# Patient Record
Sex: Male | Born: 1995
Health system: Southern US, Community
[De-identification: ages and names within clinical notes are randomized; demographics above are authoritative.]

## PROBLEM LIST (undated history)

## (undated) DIAGNOSIS — K5792 Diverticulitis of intestine, part unspecified, without perforation or abscess without bleeding: Secondary | ICD-10-CM

## (undated) DIAGNOSIS — I10 Essential (primary) hypertension: Secondary | ICD-10-CM

## (undated) HISTORY — PX: COLON SURGERY: SHX602

---
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
## 2015-09-16 ENCOUNTER — Emergency Department (HOSPITAL_COMMUNITY)
Admission: EM | Admit: 2015-09-16 | Discharge: 2015-09-16 | Disposition: A | Discharge status: Home / Self Care | Payer: Self-pay | Attending: Emergency Medicine | Admitting: Emergency Medicine

## 2015-09-16 ENCOUNTER — Encounter (HOSPITAL_COMMUNITY): Payer: Self-pay | Admitting: *Deleted

## 2015-09-16 DIAGNOSIS — H6123 Impacted cerumen, bilateral: Secondary | ICD-10-CM | POA: Insufficient documentation

## 2015-09-16 MED ORDER — NEOMYCIN-POLYMYXIN-HC 1 % OT SOLN
4.0000 [drp] | Freq: Four times a day (QID) | OTIC | Status: DC
  Administered 2015-09-16: 4 [drp] via OTIC
  Filled 2015-09-16: qty 10

## 2015-09-16 NOTE — Discharge Instructions (Signed)
Use the ear drops 4 drops in each ear three times a day for the next 5 days.   Cerumen Impaction The structures of the external ear canal secrete a waxy substance known as cerumen. Excess cerumen can build up in the ear canal, causing a condition known as cerumen impaction. Cerumen impaction can cause ear pain and disrupt the function of the ear. The rate of cerumen production differs for each individual. In certain individuals, the configuration of the ear canal may decrease his or her ability to naturally remove cerumen. CAUSES Cerumen impaction is caused by excessive cerumen production or buildup. RISK FACTORS  Frequent use of swabs to clean ears.  Having narrow ear canals.  Having eczema.  Being dehydrated. SIGNS AND SYMPTOMS  Diminished hearing.  Ear drainage.  Ear pain.  Ear itch. TREATMENT Treatment may involve:  Over-the-counter or prescription ear drops to soften the cerumen.  Removal of cerumen by a health care provider. This may be done with:  Irrigation with warm water. This is the most common method of removal.  Ear curettes and other instruments.  Surgery. This may be done in severe cases. HOME CARE INSTRUCTIONS  Take medicines only as directed by your health care provider.  Do not insert objects into the ear with the intent of cleaning the ear. PREVENTION  Do not insert objects into the ear, even with the intent of cleaning the ear. Removing cerumen as a part of normal hygiene is not necessary, and the use of swabs in the ear canal is not recommended.  Drink enough water to keep your urine clear or pale yellow.  Control your eczema if you have it. SEEK MEDICAL CARE IF:  You develop ear pain.  You develop bleeding from the ear.  The cerumen does not clear after you use ear drops as directed.   This information is not intended to replace advice given to you by your health care provider. Make sure you discuss any questions you have with your health  care provider.   Document Released: 04/09/2004 Document Revised: 03/23/2014 Document Reviewed: 10/17/2014 Elsevier Interactive Patient Education [employer]2016 Elsevier Inc.

## 2015-09-16 NOTE — ED Notes (Signed)
PA-C to see and assess patient before RN assessment. See PA note. 

## 2015-09-16 NOTE — ED Notes (Signed)
Patient verbalized understanding of discharge instructions and denies any further needs or questions at this time. VS stable. Patient ambulatory with steady gait, pt declined wheelchair, RN escorted to ED entrance.

## 2015-09-16 NOTE — ED Provider Notes (Signed)
CSN: [id number]     Arrival date & time 09/16/15  1500 History   First MD Initiated Contact with Patient 09/16/15 1623     Chief Complaint  Patient presents with  . Otalgia     (Consider location/radiation/quality/duration/timing/severity/associated sxs/prior Treatment) HPI Saunders GlanceStevie L Hendry is a 20 y.o. male who presents to the ED with left ear fullness that started over the last week and has gotten worse. He denies every having ear problems in the past and has not been swimming recently.   History reviewed. No pertinent past medical history. History reviewed. No pertinent past surgical history. No family history on file. Social History  Substance Use Topics  . Smoking status: Never Smoker   . Smokeless tobacco: None  . Alcohol Use: Yes     Comment: occ    Review of Systems Negative except as stated in HPI   Allergies  Review of patient's allergies indicates no known allergies.  Home Medications   Prior to Admission medications   Not on File   BP 154/72 mmHg  Pulse 70  Temp(Src) 98.4 F (36.9 C)  Resp 16  SpO2 99% Physical Exam  Constitutional: He is oriented to person, place, and time. He appears well-developed and well-nourished. No distress.  HENT:  Head: Normocephalic.  Bilateral cerumen impaction  Eyes: Conjunctivae and EOM are normal.  Neck: Normal range of motion. Neck supple.  Cardiovascular: Normal rate.   Pulmonary/Chest: Effort normal.  Abdominal: Soft.  Musculoskeletal: Normal range of motion.  Neurological: He is alert and oriented to person, place, and time. No cranial nerve deficit.  Skin: Skin is warm and dry.  Psychiatric: He has a normal mood and affect. His behavior is normal.  Nursing note and vitals reviewed.   ED Course  Procedures (including critical care time) Ear irrigation by me with good results, large amount of Cerumen removed from both ear canals.  Patient tolerated the procedure without any immediate complications.  Re examined  after irrigation, TM's normal, ear canals without mild erythema Cortisporin Otic Susp. 4 drops to each ear.  Labs Review Labs Reviewed - No data to display   MDM  20 y.o. male with decreased hearing and full feeling bilateral ears left >right stable for d/c without infection. Discussed with the patient and all questioned fully answered. He will return if any problems arise.  Final diagnoses:  Cerumen impaction, bilateral        Janne NapoleonHope M Antanasia Kaczynski, NP 09/17/15 [phone]  Marily MemosJason Mesner, MD 09/18/15 [phone]

## 2015-09-16 NOTE — ED Notes (Signed)
Left ear pain for a while and feels like there is a blockage in it and hard to hear.

## 2016-04-10 ENCOUNTER — Ambulatory Visit (HOSPITAL_COMMUNITY)
Admission: EM | Admit: 2016-04-10 | Discharge: 2016-04-10 | Disposition: A | Discharge status: Home / Self Care | Payer: Self-pay | Attending: Family Medicine | Admitting: Family Medicine

## 2016-04-10 ENCOUNTER — Encounter (HOSPITAL_COMMUNITY): Payer: Self-pay | Admitting: Emergency Medicine

## 2016-04-10 DIAGNOSIS — L308 Other specified dermatitis: Secondary | ICD-10-CM

## 2016-04-10 DIAGNOSIS — Z760 Encounter for issue of repeat prescription: Secondary | ICD-10-CM

## 2016-04-10 MED ORDER — TRIAMCINOLONE ACETONIDE 0.1 % EX CREA: 1.0000 "application " | TOPICAL_CREAM | Freq: Two times a day (BID) | CUTANEOUS | 2 refills | Status: AC

## 2016-04-10 MED ORDER — METHYLPREDNISOLONE ACETATE 80 MG/ML IJ SUSP
INTRAMUSCULAR | Status: AC
  Filled 2016-04-10: qty 1

## 2016-04-10 MED ORDER — METHYLPREDNISOLONE ACETATE 80 MG/ML IJ SUSP
80.0000 mg | Freq: Once | INTRAMUSCULAR | Status: AC
  Administered 2016-04-10: 80 mg via INTRAMUSCULAR

## 2016-04-10 NOTE — ED Triage Notes (Signed)
Here for eczema flare up onset 4 weeks... Needing refills on triamcinolone   Has not had cream since 12/2015  A&O x4.. NAD

## 2016-04-10 NOTE — ED Provider Notes (Signed)
[facility]    CSN: [id number] Arrival date & time: 04/10/16  1433     History   Chief Complaint Chief Complaint  Patient presents with  . Medication Refill  . Eczema    HPI Steven Wall is a 21 y.o. male.   The history is provided by the patient.  Medication Refill  Medications/supplies requested:  Triamcinolone cream Reason for request:  Medications ran out and prescriptions lost Medications taken before: yes - see home medications   Patient has complete original prescription information: yes (apply bid.)     History reviewed. No pertinent past medical history.  There are no active problems to display for this patient.   History reviewed. No pertinent surgical history.     Home Medications    Prior to Admission medications   Medication Sig Start Date End Date Taking? Authorizing Provider  triamcinolone cream (KENALOG) 0.1 % Apply 1 application topically 2 (two) times daily. 04/10/16   Linna HoffJames D Aceton Kinnear, MD    Family History No family history on file.  Social History Social History  Substance Use Topics  . Smoking status: Never Smoker  . Smokeless tobacco: Never Used  . Alcohol use Yes     Comment: occ     Allergies   Patient has no known allergies.   Review of Systems Review of Systems  Constitutional: Negative.   Musculoskeletal: Negative.   Skin: Positive for rash.  All other systems reviewed and are negative.    Physical Exam Triage Vital Signs ED Triage Vitals [04/10/16 1548]  Enc Vitals Group     BP 141/78     Pulse Rate 83     Resp 18     Temp 97.8 F (36.6 C)     Temp Source Oral     SpO2 100 %     Weight      Height      Head Circumference      Peak Flow      Pain Score 6     Pain Loc      Pain Edu?      Excl. in GC?    No data found.   Updated Vital Signs BP 141/78 (BP Location: Right Arm)   Pulse 83   Temp 97.8 F (36.6 C) (Oral)   Resp 18   SpO2 100%   Visual Acuity Right Eye Distance:   Left  Eye Distance:   Bilateral Distance:    Right Eye Near:   Left Eye Near:    Bilateral Near:     Physical Exam  Constitutional: He appears well-developed and well-nourished.  Skin: Skin is warm and dry. Rash noted.  Dry eczema rash over body.  Nursing note and vitals reviewed.    UC Treatments / Results  Labs (all labs ordered are listed, but only abnormal results are displayed) Labs Reviewed - No data to display  EKG  EKG Interpretation None       Radiology No results found.  Procedures Procedures (including critical care time)  Medications Ordered in UC Medications  methylPREDNISolone acetate (DEPO-MEDROL) injection 80 mg (80 mg Intramuscular Given 04/10/16 1636)     Initial Impression / Assessment and Plan / UC Course  I have reviewed the triage vital signs and the nursing notes.  Pertinent labs & imaging results that were available during my care of the patient were reviewed by me and considered in my medical decision making (see chart for details).  Final Clinical Impressions(s) / UC Diagnoses   Final diagnoses:  Encounter for medication refill    New Prescriptions Discharge Medication List as of 04/10/2016  4:31 PM       Linna Hoff, MD 04/16/16 [phone]

## 2016-04-10 NOTE — Discharge Instructions (Signed)
Use cream as needed, see your doctori if further refills are needed.

## 2018-01-25 ENCOUNTER — Emergency Department: Payer: Self-pay

## 2018-01-25 ENCOUNTER — Inpatient Hospital Stay
Admission: EM | Admit: 2018-01-25 | Discharge: 2018-01-28 | DRG: 392 | Disposition: A | Discharge status: Home / Self Care | Payer: Self-pay | Attending: Surgery | Admitting: Surgery

## 2018-01-25 ENCOUNTER — Other Ambulatory Visit: Payer: Self-pay

## 2018-01-25 DIAGNOSIS — K5720 Diverticulitis of large intestine with perforation and abscess without bleeding: Principal | ICD-10-CM | POA: Diagnosis present

## 2018-01-25 DIAGNOSIS — K5792 Diverticulitis of intestine, part unspecified, without perforation or abscess without bleeding: Secondary | ICD-10-CM

## 2018-01-25 DIAGNOSIS — K5732 Diverticulitis of large intestine without perforation or abscess without bleeding: Secondary | ICD-10-CM | POA: Diagnosis present

## 2018-01-25 DIAGNOSIS — E876 Hypokalemia: Secondary | ICD-10-CM | POA: Diagnosis not present

## 2018-01-25 LAB — COMPREHENSIVE METABOLIC PANEL
ALBUMIN: 3.8 g/dL (ref 3.5–5.0)
ALT: 13 U/L (ref 0–44)
ANION GAP: 13 (ref 5–15)
AST: 15 U/L (ref 15–41)
Alkaline Phosphatase: 47 U/L (ref 38–126)
BUN: 11 mg/dL (ref 6–20)
CHLORIDE: 99 mmol/L (ref 98–111)
CO2: 22 mmol/L (ref 22–32)
Calcium: 9.1 mg/dL (ref 8.9–10.3)
Creatinine, Ser: 1.19 mg/dL (ref 0.61–1.24)
GFR calc Af Amer: >60 mL/min (ref >60)
GFR calc non Af Amer: >60 mL/min (ref >60)
Glucose, Bld: 116 mg/dL — ABNORMAL HIGH (ref 70–99)
POTASSIUM: 3.9 mmol/L (ref 3.5–5.1)
SODIUM: 134 mmol/L — AB (ref 135–145)
Total Bilirubin: 0.8 mg/dL (ref 0.3–1.2)
Total Protein: 8.7 g/dL — ABNORMAL HIGH (ref 6.5–8.1)

## 2018-01-25 LAB — URINALYSIS, COMPLETE (UACMP) WITH MICROSCOPIC
BACTERIA UA: NONE SEEN
Bilirubin Urine: NEGATIVE
GLUCOSE, UA: NEGATIVE mg/dL
HGB URINE DIPSTICK: NEGATIVE
KETONES UR: 80 mg/dL — AB
Leukocytes, UA: NEGATIVE
NITRITE: NEGATIVE
PROTEIN: 30 mg/dL — AB
Specific Gravity, Urine: 1.027 (ref 1.005–1.030)
pH: 6 (ref 5.0–8.0)

## 2018-01-25 LAB — CBC
HEMATOCRIT: 42.6 % (ref 39.0–52.0)
HEMOGLOBIN: 14.7 g/dL (ref 13.0–17.0)
MCH: 29.8 pg (ref 26.0–34.0)
MCHC: 34.5 g/dL (ref 30.0–36.0)
MCV: 86.4 fL (ref 80.0–100.0)
Platelets: 238 10*3/uL (ref 150–400)
RBC: 4.93 MIL/uL (ref 4.22–5.81)
RDW: 12.4 % (ref 11.5–15.5)
WBC: 11.3 10*3/uL — ABNORMAL HIGH (ref 4.0–10.5)
nRBC: 0 % (ref 0.0–0.2)

## 2018-01-25 LAB — LIPASE, BLOOD: LIPASE: 35 U/L (ref 11–51)

## 2018-01-25 MED ORDER — OXYCODONE-ACETAMINOPHEN 5-325 MG PO TABS: 1.0000 | ORAL_TABLET | Freq: Four times a day (QID) | ORAL | 0 refills | Status: DC | PRN

## 2018-01-25 MED ORDER — METRONIDAZOLE IN NACL 5-0.79 MG/ML-% IV SOLN
500.0000 mg | Freq: Once | INTRAVENOUS | Status: AC
  Administered 2018-01-25: 500 mg via INTRAVENOUS
  Filled 2018-01-25: qty 100

## 2018-01-25 MED ORDER — MORPHINE SULFATE (PF) 4 MG/ML IV SOLN
4.0000 mg | Freq: Once | INTRAVENOUS | Status: AC
  Administered 2018-01-25: 4 mg via INTRAVENOUS

## 2018-01-25 MED ORDER — HYDROCODONE-ACETAMINOPHEN 5-325 MG PO TABS
1.0000 | ORAL_TABLET | ORAL | Status: DC | PRN
  Administered 2018-01-26: 2 via ORAL
  Administered 2018-01-26: 1 via ORAL
  Filled 2018-01-25: qty 2
  Filled 2018-01-25: qty 1

## 2018-01-25 MED ORDER — CIPROFLOXACIN IN D5W 400 MG/200ML IV SOLN
400.0000 mg | Freq: Once | INTRAVENOUS | Status: AC
  Administered 2018-01-25: 400 mg via INTRAVENOUS
  Filled 2018-01-25: qty 200

## 2018-01-25 MED ORDER — ONDANSETRON HCL 4 MG/2ML IJ SOLN
4.0000 mg | Freq: Once | INTRAMUSCULAR | Status: AC
  Administered 2018-01-25: 4 mg via INTRAVENOUS

## 2018-01-25 MED ORDER — MORPHINE SULFATE (PF) 4 MG/ML IV SOLN
INTRAVENOUS | Status: AC
  Administered 2018-01-25: 4 mg via INTRAVENOUS
  Filled 2018-01-25: qty 1

## 2018-01-25 MED ORDER — DOCUSATE SODIUM 100 MG PO CAPS: 100.0000 mg | ORAL_CAPSULE | Freq: Every day | ORAL | 0 refills | Status: DC

## 2018-01-25 MED ORDER — ONDANSETRON HCL 4 MG/2ML IJ SOLN: 4.0000 mg | Freq: Four times a day (QID) | INTRAMUSCULAR | Status: DC | PRN

## 2018-01-25 MED ORDER — ONDANSETRON 4 MG PO TBDP
4.0000 mg | ORAL_TABLET | Freq: Four times a day (QID) | ORAL | Status: DC | PRN
  Administered 2018-01-27: 4 mg via ORAL
  Filled 2018-01-25: qty 1

## 2018-01-25 MED ORDER — METRONIDAZOLE 500 MG PO TABS: 500.0000 mg | ORAL_TABLET | Freq: Three times a day (TID) | ORAL | 0 refills | Status: AC

## 2018-01-25 MED ORDER — METRONIDAZOLE IN NACL 5-0.79 MG/ML-% IV SOLN
500.0000 mg | Freq: Three times a day (TID) | INTRAVENOUS | Status: DC
  Administered 2018-01-25 – 2018-01-28 (×8): 500 mg via INTRAVENOUS
  Filled 2018-01-25 (×11): qty 100

## 2018-01-25 MED ORDER — PANTOPRAZOLE SODIUM 40 MG IV SOLR
40.0000 mg | Freq: Every day | INTRAVENOUS | Status: DC
  Administered 2018-01-25 – 2018-01-27 (×3): 40 mg via INTRAVENOUS
  Filled 2018-01-25 (×3): qty 40

## 2018-01-25 MED ORDER — SODIUM CHLORIDE 0.9 % IV BOLUS
1000.0000 mL | Freq: Once | INTRAVENOUS | Status: AC
  Administered 2018-01-25: 1000 mL via INTRAVENOUS

## 2018-01-25 MED ORDER — CIPROFLOXACIN IN D5W 400 MG/200ML IV SOLN
400.0000 mg | Freq: Two times a day (BID) | INTRAVENOUS | Status: DC
  Administered 2018-01-26 – 2018-01-27 (×5): 400 mg via INTRAVENOUS
  Filled 2018-01-25 (×7): qty 200

## 2018-01-25 MED ORDER — CIPROFLOXACIN HCL 500 MG PO TABS: 500.0000 mg | ORAL_TABLET | Freq: Two times a day (BID) | ORAL | 0 refills | Status: AC

## 2018-01-25 MED ORDER — TRAMADOL HCL 50 MG PO TABS
50.0000 mg | ORAL_TABLET | Freq: Four times a day (QID) | ORAL | Status: DC | PRN
  Administered 2018-01-26: 50 mg via ORAL
  Filled 2018-01-25: qty 1

## 2018-01-25 MED ORDER — ONDANSETRON 8 MG PO TBDP: 8.0000 mg | ORAL_TABLET | Freq: Three times a day (TID) | ORAL | 0 refills | Status: DC | PRN

## 2018-01-25 MED ORDER — IOPAMIDOL (ISOVUE-300) INJECTION 61%
30.0000 mL | Freq: Once | INTRAVENOUS | Status: AC
  Administered 2018-01-25: 30 mL via ORAL

## 2018-01-25 MED ORDER — ONDANSETRON HCL 4 MG/2ML IJ SOLN
INTRAMUSCULAR | Status: AC
  Administered 2018-01-25: 4 mg via INTRAVENOUS
  Filled 2018-01-25: qty 2

## 2018-01-25 MED ORDER — LACTATED RINGERS IV SOLN
INTRAVENOUS | Status: DC
  Administered 2018-01-25 – 2018-01-27 (×4): via INTRAVENOUS

## 2018-01-25 MED ORDER — MORPHINE SULFATE (PF) 2 MG/ML IV SOLN: 2.0000 mg | INTRAVENOUS | Status: DC | PRN

## 2018-01-25 MED ORDER — IOPAMIDOL (ISOVUE-300) INJECTION 61%
100.0000 mL | Freq: Once | INTRAVENOUS | Status: AC | PRN
  Administered 2018-01-25: 100 mL via INTRAVENOUS

## 2018-01-25 NOTE — ED Provider Notes (Signed)
[facility] Emergency Department Provider Note ____________________________________________   First MD Initiated Contact with Patient 01/25/18 0915     (approximate)  I have reviewed the triage vital signs and the nursing notes.   HISTORY  Chief Complaint Abdominal Pain and Emesis    HPI Steven Wall is a 22 y.o. male with no significant past medical history who presents with left lower quadrant abdominal pain, gradual onset over the last 8 to 9 days, worsening in the last day, constant, and associated with nausea with one episode of brownish vomiting today.  He denies any diarrhea.  He states it is been somewhat constipated and his last bowel movement was 3 days ago.  He denies any urinary symptoms or fever or chills.  No prior history of this pain.  History reviewed. No pertinent past medical history.  Patient Active Problem List   Diagnosis Date Noted  . Diverticulitis large intestine 01/25/2018    History reviewed. No pertinent surgical history.  Prior to Admission medications   Medication Sig Start Date End Date Taking? Authorizing Provider  ciprofloxacin (CIPRO) 500 MG tablet Take 1 tablet (500 mg total) by mouth 2 (two) times daily for 10 days. 01/25/18 02/04/18  Dionne Bucy, MD  docusate sodium (COLACE) 100 MG capsule Take 1 capsule (100 mg total) by mouth daily. 01/25/18   Dionne Bucy, MD  metroNIDAZOLE (FLAGYL) 500 MG tablet Take 1 tablet (500 mg total) by mouth 3 (three) times daily for 10 days. 01/25/18 02/04/18  Dionne Bucy, MD  ondansetron (ZOFRAN ODT) 8 MG disintegrating tablet Take 1 tablet (8 mg total) by mouth every 8 (eight) hours as needed for nausea or vomiting. 01/25/18   Dionne Bucy, MD  oxyCODONE-acetaminophen (PERCOCET) 5-325 MG tablet Take 1 tablet by mouth every 6 (six) hours as needed for up to 3 days for severe pain. 01/25/18 01/28/18  Dionne Bucy, MD  triamcinolone cream (KENALOG) 0.1  % Apply 1 application topically 2 (two) times daily. Patient not taking: Reported on 01/25/2018 04/10/16   Linna Hoff, MD    Allergies Patient has no known allergies.  No family history on file.  Social History Social History   Tobacco Use  . Smoking status: Never Smoker  . Smokeless tobacco: Never Used  Substance Use Topics  . Alcohol use: Yes    Comment: occ  . Drug use: No    Review of Systems  Constitutional: No fever.  Eyes: No redness.. ENT: No sore throat. Cardiovascular: Denies chest pain. Respiratory: Denies shortness of breath. Gastrointestinal: Positive for nausea.  Genitourinary: Negative for dysuria.  Musculoskeletal: Negative for back pain. Skin: Negative for rash. Neurological: Negative for headache.   ____________________________________________   PHYSICAL EXAM:  VITAL SIGNS: ED Triage Vitals  Enc Vitals Group     BP 01/25/18 0902 (!) 144/88     Pulse Rate 01/25/18 0902 86     Resp 01/25/18 0902 18     Temp 01/25/18 0902 98.6 F (37 C)     Temp Source 01/25/18 0902 Oral     SpO2 01/25/18 0902 100 %     Weight 01/25/18 0901 255 lb (115.7 kg)     Height 01/25/18 0901 6' (1.829 m)     Head Circumference --      Peak Flow --      Pain Score 01/25/18 0937 10     Pain Loc --      Pain Edu? --      Excl. in GC? --  Constitutional: Alert and oriented.  Uncomfortable appearing but in no acute distress. Eyes: Conjunctivae are normal.  No scleral icterus. Head: Atraumatic. Nose: No congestion/rhinnorhea. Mouth/Throat: Mucous membranes are slightly dry.   Neck: Normal range of motion.  Cardiovascular: Good peripheral circulation. Respiratory: Normal respiratory effort.  No retractions. Gastrointestinal: Soft with moderate left lower quadrant tenderness. No distention.  Genitourinary: No flank tenderness. Musculoskeletal: Extremities warm and well perfused.  Neurologic:  Normal speech and language. No gross focal neurologic deficits are  appreciated.  Skin:  Skin is warm and dry. No rash noted. Psychiatric: Mood and affect are normal. Speech and behavior are normal.  ____________________________________________   LABS (all labs ordered are listed, but only abnormal results are displayed)  Labs Reviewed  COMPREHENSIVE METABOLIC PANEL - Abnormal; Notable for the following components:      Result Value   Sodium 134 (*)    Glucose, Bld 116 (*)    Total Protein 8.7 (*)    All other components within normal limits  CBC - Abnormal; Notable for the following components:   WBC 11.3 (*)    All other components within normal limits  URINALYSIS, COMPLETE (UACMP) WITH MICROSCOPIC - Abnormal; Notable for the following components:   Color, Urine YELLOW (*)    APPearance CLEAR (*)    Ketones, ur 80 (*)    Protein, ur 30 (*)    All other components within normal limits  LIPASE, BLOOD   ____________________________________________  EKG   ____________________________________________  RADIOLOGY  CT abdomen: Acute diverticulitis  ____________________________________________   PROCEDURES  Procedure(s) performed: No  Procedures  Critical Care performed: No ____________________________________________   INITIAL IMPRESSION / ASSESSMENT AND PLAN / ED COURSE  Pertinent labs & imaging results that were available during my care of the patient were reviewed by me and considered in my medical decision making (see chart for details).  22 year old male with no significant past medical history presents with left lower quadrant abdominal pain which is been gradually worsening over the last 8 or 9 days, and associated with nausea and an episode of vomiting today.  On exam the patient is uncomfortable appearing.  His vital signs are normal except for hypertension.  He does have significant tenderness in the left lower quadrant.  Differential includes diverticulitis, colitis, UTI/cystitis, or ureteral  stone.  ----------------------------------------- 2:34 PM on 01/25/2018 -----------------------------------------  CT shows diverticulitis with 5 cm abscess.  I initiated IV antibiotics.  I consulted Dr. Tonna Boehringer from general surgery who has evaluated the patient.  Patient will be admitted to the inpatient surgery service.   ____________________________________________   FINAL CLINICAL IMPRESSION(S) / ED DIAGNOSES  Final diagnoses:  Diverticulitis      NEW MEDICATIONS STARTED DURING THIS VISIT:  New Prescriptions   CIPROFLOXACIN (CIPRO) 500 MG TABLET    Take 1 tablet (500 mg total) by mouth 2 (two) times daily for 10 days.   DOCUSATE SODIUM (COLACE) 100 MG CAPSULE    Take 1 capsule (100 mg total) by mouth daily.   METRONIDAZOLE (FLAGYL) 500 MG TABLET    Take 1 tablet (500 mg total) by mouth 3 (three) times daily for 10 days.   ONDANSETRON (ZOFRAN ODT) 8 MG DISINTEGRATING TABLET    Take 1 tablet (8 mg total) by mouth every 8 (eight) hours as needed for nausea or vomiting.   OXYCODONE-ACETAMINOPHEN (PERCOCET) 5-325 MG TABLET    Take 1 tablet by mouth every 6 (six) hours as needed for up to 3 days for severe pain.  Note:  This document was prepared using Dragon voice recognition software and may include unintentional dictation errors.    Dionne Bucy, MD 01/25/18 1434

## 2018-01-25 NOTE — ED Notes (Signed)
CT notified pt finished with contrast. Spoke with Sheralyn Boatman

## 2018-01-25 NOTE — ED Notes (Signed)
Surgeon at bedside.  

## 2018-01-25 NOTE — ED Notes (Signed)
Informed pt that oncoming nurse Jeannett SeniorStephen will be taking over his care. Urine sent to lab.

## 2018-01-25 NOTE — H&P (Signed)
Subjective:   CC: diverticulitis  HPI:  Steven Wall is a 22 y.o. male [facility]  Symptoms were first noted 1 week ago. Pain is sharp, confined to the LLQ, without radiation.  Associated with some nausea, changes in BM.  Exacerbated by nothing specific.  First episode, but has family history of diverticulitis   Past Medical History: none reported  Past Surgical History: denies any surgery  Family History: mother and grandfather with hx of diverticulitis  Social History:  reports that he has never smoked. He has never used smokeless tobacco. He reports that he drinks alcohol. He reports that he does not use drugs.  Current Medications: taking none  Allergies:  Allergies as of 01/25/2018  . (No Known Allergies)    ROS:  A 15 point review of systems was performed and pertinent positives and negatives noted in HPI   Objective:     BP (!) 141/72   Pulse 86   Temp 98.6 F (37 C) (Oral)   Resp 15   Ht 6' (1.829 m)   Wt 115.7 kg   SpO2 100%   BMI 34.58 kg/m   Constitutional :  alert, cooperative, appears stated age and no distress  Lymphatics/Throat:  no asymmetry, masses, or scars  Respiratory:  clear to auscultation bilaterally  Cardiovascular:  regular rate and rhythm  Gastrointestinal: soft, no guarding but focal tenderness noted in LLQ, no rebound tenderness.   Musculoskeletal: Steady gait and movement  Skin: Cool and moist   Psychiatric: Normal affect, non-agitated, not confused       LABS:  CMP Latest Ref Rng & Units 01/25/2018  Glucose 70 - 99 mg/dL 119(J116(H)  BUN 6 - 20 mg/dL 11  Creatinine 4.780.61 - 2.951.24 mg/dL 6.211.19  Sodium 308135 - 657145 mmol/L 134(L)  Potassium 3.5 - 5.1 mmol/L 3.9  Chloride 98 - 111 mmol/L 99  CO2 22 - 32 mmol/L 22  Calcium 8.9 - 10.3 mg/dL 9.1  Total Protein 6.5 - 8.1 g/dL 8.4(O8.7(H)  Total Bilirubin 0.3 - 1.2 mg/dL 0.8  Alkaline Phos 38 - 126 U/L 47  AST 15 - 41 U/L 15  ALT 0 - 44 U/L 13   CBC Latest Ref  Rng & Units 01/25/2018  WBC 4.0 - 10.5 K/uL 11.3(H)  Hemoglobin 13.0 - 17.0 g/dL 96.214.7  Hematocrit 95.239.0 - 52.0 % 42.6  Platelets 150 - 400 K/uL 238    RADS: CLINICAL DATA:  Lower abdominal pain for 1 week.  Fever and chills.  EXAM: CT ABDOMEN AND PELVIS WITH CONTRAST  TECHNIQUE: Multidetector CT imaging of the abdomen and pelvis was performed using the standard protocol following bolus administration of intravenous contrast.  CONTRAST:  100mL ISOVUE-300 IOPAMIDOL (ISOVUE-300) INJECTION 61%  COMPARISON:  None.  FINDINGS: Lower chest: The lung bases are clear of acute process. No pleural effusion or pulmonary lesions. The heart is normal in size. No pericardial effusion. The distal esophagus and aorta are unremarkable.  Hepatobiliary: No focal hepatic lesions or intrahepatic biliary dilatation. The gallbladder is normal. No common bile duct dilatation.  Pancreas: No mass, inflammation or ductal dilatation.  Spleen: Normal size.  No focal lesions.  Adrenals/Urinary Tract: The adrenal glands and kidneys are unremarkable. The bladder is normal.  Stomach/Bowel: The stomach, duodenum and small bowel are unremarkable. No acute inflammatory changes, mass lesions or obstructive findings.  Diverticulosis of the sigmoid colon is noted with acute diverticulitis and a large diverticular abscess measuring 5.3 x 3.8 cm.  The remainder of the colon is unremarkable except for scattered diverticuli. The appendix is normal.  Vascular/Lymphatic: The aorta is normal in caliber. No dissection. The branch vessels are patent. The major venous structures are patent. No mesenteric or retroperitoneal mass or adenopathy. Small scattered lymph nodes are noted.  Reproductive: The prostate gland and seminal vesicles are unremarkable.  Other: No inguinal mass or adenopathy.  No inguinal hernia.  Musculoskeletal: No significant findings.  IMPRESSION: 1. Acute diverticulitis  involving the sigmoid colon with associated 5 cm diverticular abscess. 2. No other acute abdominal findings and no mass lesions or adenopathy.   Electronically Signed   By: Rudie Meyer M.D.   On: 01/25/2018 11:12 Assessment:   Sigmoid diverticulitis with associated abscess.  Plan:   Only focal peritonitis noted on exam, with stable vitals and no evidence of uncontained perforation on exam.  Will discuss with IR if drainage is possible at this point.  Admit, NPO, cipro, flagyl, repeat serial exams and labs in the meantime.  No surgical intervention needed at this point.  Discussed in detail plan with patient, sister, and aunt and all are in agreement and voiced understanding.

## 2018-01-25 NOTE — ED Notes (Signed)
Pt reports lower abdominal pain since over a week ago, pt states the pain continues to worsen.  Reports vomiting, last vomited prior to leaving home.  Pt states he has not been able to eat since Saturday.  Denies any urinary sxs. Reports he has been able to drink water.  Pt shivering in room asked for several warm blankets.

## 2018-01-25 NOTE — ED Triage Notes (Signed)
[facility].  Vomit is reportedly brown.

## 2018-01-25 NOTE — Discharge Instructions (Addendum)
Take the antibiotics (ciprofloxacin and metronidazole) as prescribed and finish the full course even if your symptoms get better.  You should also take the Colace stool softener daily as needed  You may use the Zofran as needed for nausea.   tylenol and advil as needed for discomfort.  Please alternate between the two every four hours as needed for pain.    325-650mg  every 8hrs to max of 4000mg /24hrs for the tylenol.    Advil up to 800mg  per dose every 8hrs as needed for pain.    Return to the ER for new, worsening, persistent severe pain, nausea or vomiting, fever, or any other new or worsening symptoms that concern you.  Follow drain care teaching as done by nursing prior to discharge until followup with radiology group for possible removal

## 2018-01-26 ENCOUNTER — Inpatient Hospital Stay: Payer: Self-pay

## 2018-01-26 LAB — BASIC METABOLIC PANEL
Anion gap: 9 (ref 5–15)
BUN: 7 mg/dL (ref 6–20)
CHLORIDE: 102 mmol/L (ref 98–111)
CO2: 26 mmol/L (ref 22–32)
CREATININE: 1.08 mg/dL (ref 0.61–1.24)
Calcium: 8.5 mg/dL — ABNORMAL LOW (ref 8.9–10.3)
GFR calc Af Amer: >60 mL/min (ref >60)
GFR calc non Af Amer: >60 mL/min (ref >60)
Glucose, Bld: 87 mg/dL (ref 70–99)
Potassium: 3.6 mmol/L (ref 3.5–5.1)
Sodium: 137 mmol/L (ref 135–145)

## 2018-01-26 LAB — CBC
HCT: 40.8 % (ref 39.0–52.0)
HEMOGLOBIN: 13.6 g/dL (ref 13.0–17.0)
MCH: 30 pg (ref 26.0–34.0)
MCHC: 33.3 g/dL (ref 30.0–36.0)
MCV: 89.9 fL (ref 80.0–100.0)
Platelets: 200 10*3/uL (ref 150–400)
RBC: 4.54 MIL/uL (ref 4.22–5.81)
RDW: 12.7 % (ref 11.5–15.5)
WBC: 8.9 10*3/uL (ref 4.0–10.5)
nRBC: 0 % (ref 0.0–0.2)

## 2018-01-26 LAB — PHOSPHORUS: Phosphorus: 3.8 mg/dL (ref 2.5–4.6)

## 2018-01-26 LAB — MAGNESIUM: Magnesium: 1.9 mg/dL (ref 1.7–2.4)

## 2018-01-26 MED ORDER — SODIUM CHLORIDE 0.9% FLUSH
5.0000 mL | Freq: Three times a day (TID) | INTRAVENOUS | Status: DC
  Administered 2018-01-26 – 2018-01-28 (×6): 5 mL

## 2018-01-26 MED ORDER — MIDAZOLAM HCL 2 MG/2ML IJ SOLN
INTRAMUSCULAR | Status: AC | PRN
  Administered 2018-01-26 (×3): 1 mg via INTRAVENOUS

## 2018-01-26 MED ORDER — MIDAZOLAM HCL 5 MG/5ML IJ SOLN
INTRAMUSCULAR | Status: AC
  Filled 2018-01-26: qty 5

## 2018-01-26 MED ORDER — FENTANYL CITRATE (PF) 100 MCG/2ML IJ SOLN
INTRAMUSCULAR | Status: AC | PRN
  Administered 2018-01-26 (×2): 50 ug via INTRAVENOUS
  Administered 2018-01-26 (×2): 25 ug via INTRAVENOUS

## 2018-01-26 MED ORDER — FENTANYL CITRATE (PF) 100 MCG/2ML IJ SOLN
INTRAMUSCULAR | Status: AC
  Filled 2018-01-26: qty 4

## 2018-01-26 NOTE — Progress Notes (Signed)
Subjective:  CC:  Steven Wall is a 22 y.o. male  Hospital stay day 1,   acute diverticulitis  HPI: No issues overnight. Feeling better  ROS:  A 5 point review of systems was performed and pertinent positives and negatives noted in HPI.   Objective:      Temp:  [98 F (36.7 C)-99.4 F (37.4 C)] 98 F (36.7 C) (11/13 0736) Pulse Rate:  [79-104] 83 (11/13 0736) Resp:  [10-28] 12 (11/13 0736) BP: (116-156)/(56-97) 140/83 (11/13 0736) SpO2:  [96 %-100 %] 100 % (11/13 0736) Weight:  [115.7 kg] 115.7 kg (11/12 0902)     Height: 6' (182.9 cm) Weight: 115.7 kg BMI (Calculated): 34.58   Intake/Output this shift:  Total I/O In: 287.2 [I.V.:187.2; IV Piggyback:100] Out: -        Constitutional :  alert, cooperative, appears stated age and no distress  Respiratory:  clear to auscultation bilaterally  Cardiovascular:  regular rate and rhythm  Gastrointestinal: soft, non-tender; bowel sounds normal; no masses,  no organomegaly.   Skin: Cool and moist.   Psychiatric: Normal affect, non-agitated, not confused       LABS:  CMP Latest Ref Rng & Units 01/26/2018 01/25/2018  Glucose 70 - 99 mg/dL 87 161(W116(H)  BUN 6 - 20 mg/dL 7 11  Creatinine 9.600.61 - 1.24 mg/dL 4.541.08 0.981.19  Sodium 119135 - 145 mmol/L 137 134(L)  Potassium 3.5 - 5.1 mmol/L 3.6 3.9  Chloride 98 - 111 mmol/L 102 99  CO2 22 - 32 mmol/L 26 22  Calcium 8.9 - 10.3 mg/dL 1.4(N8.5(L) 9.1  Total Protein 6.5 - 8.1 g/dL - 8.7(H)  Total Bilirubin 0.3 - 1.2 mg/dL - 0.8  Alkaline Phos 38 - 126 U/L - 47  AST 15 - 41 U/L - 15  ALT 0 - 44 U/L - 13   CBC Latest Ref Rng & Units 01/26/2018 01/25/2018  WBC 4.0 - 10.5 K/uL 8.9 11.3(H)  Hemoglobin 13.0 - 17.0 g/dL 82.913.6 56.214.7  Hematocrit 13.039.0 - 52.0 % 40.8 42.6  Platelets 150 - 400 K/uL 200 238    RADS: n/a Assessment:   Sigmoid diverticulitis.  Plan for IR drainage today.  Will cont abx for infection and monitor.

## 2018-01-26 NOTE — Progress Notes (Signed)
patiient clinically stable post  Percutaneous drain placement for abscess per DR Hoss, tolerated well with vitals stable. [facility] with questions answered. Pt remained stable throughout entire procedure.

## 2018-01-26 NOTE — Consult Note (Signed)
Chief Complaint: Patient was seen in consultation today for  Chief Complaint  Patient presents with  . Abdominal Pain  . Emesis   at the request of * No referring provider recorded for this case *  Referring Physician(s): * No referring provider recorded for this case *  Supervising Physician: Jolaine Click  Patient Status: [facility] - In-pt  History of Present Illness: Steven Wall is a 22 y.o. male with acute diverticulitis and a small peri sigmoid abscess. He is referred for drainage.  History reviewed. No pertinent past medical history.  History reviewed. No pertinent surgical history.  Allergies: Patient has no known allergies.  Medications: Prior to Admission medications   Medication Sig Start Date End Date Taking? Authorizing Provider  ciprofloxacin (CIPRO) 500 MG tablet Take 1 tablet (500 mg total) by mouth 2 (two) times daily for 10 days. 01/25/18 02/04/18  Dionne Bucy, MD  docusate sodium (COLACE) 100 MG capsule Take 1 capsule (100 mg total) by mouth daily. 01/25/18   Dionne Bucy, MD  metroNIDAZOLE (FLAGYL) 500 MG tablet Take 1 tablet (500 mg total) by mouth 3 (three) times daily for 10 days. 01/25/18 02/04/18  Dionne Bucy, MD  ondansetron (ZOFRAN ODT) 8 MG disintegrating tablet Take 1 tablet (8 mg total) by mouth every 8 (eight) hours as needed for nausea or vomiting. 01/25/18   Dionne Bucy, MD  oxyCODONE-acetaminophen (PERCOCET) 5-325 MG tablet Take 1 tablet by mouth every 6 (six) hours as needed for up to 3 days for severe pain. 01/25/18 01/28/18  Dionne Bucy, MD  triamcinolone cream (KENALOG) 0.1 % Apply 1 application topically 2 (two) times daily. Patient not taking: Reported on 01/25/2018 04/10/16   Linna Hoff, MD     No family history on file.  Social History   Socioeconomic History  . Marital status: Single    Spouse name: Not on file  . Number of children: Not on file  . Years of education: Not on file  .  Highest education level: Not on file  Occupational History  . Not on file  Social Needs  . Financial resource strain: Not on file  . Food insecurity:    Worry: Not on file    Inability: Not on file  . Transportation needs:    Medical: Not on file    Non-medical: Not on file  Tobacco Use  . Smoking status: Never Smoker  . Smokeless tobacco: Never Used  Substance and Sexual Activity  . Alcohol use: Yes    Comment: occ  . Drug use: No  . Sexual activity: Not on file  Lifestyle  . Physical activity:    Days per week: Not on file    Minutes per session: Not on file  . Stress: Not on file  Relationships  . Social connections:    Talks on phone: Not on file    Gets together: Not on file    Attends religious service: Not on file    Active member of club or organization: Not on file    Attends meetings of clubs or organizations: Not on file    Relationship status: Not on file  Other Topics Concern  . Not on file  Social History Narrative  . Not on file     Review of Systems: A 12 point ROS discussed and pertinent positives are indicated in the HPI above.  All other systems are negative.  Review of Systems  Vital Signs: BP 140/83 (BP Location: Left Arm)   Pulse  83   Temp 98 F (36.7 C) (Oral)   Resp 12   Ht 6' (1.829 m)   Wt 115.7 kg   SpO2 100%   BMI 34.58 kg/m   Physical Exam  Constitutional: He is oriented to person, place, and time. He appears well-developed and well-nourished.  Cardiovascular: Normal rate and regular rhythm.  Pulmonary/Chest: Effort normal and breath sounds normal.  Abdominal: Soft. Normal appearance.  Neurological: He is alert and oriented to person, place, and time.    Imaging: Ct Abdomen Pelvis W Contrast  Result Date: 01/25/2018 CLINICAL DATA:  Lower abdominal pain for 1 week.  Fever and chills. EXAM: CT ABDOMEN AND PELVIS WITH CONTRAST TECHNIQUE: Multidetector CT imaging of the abdomen and pelvis was performed using the standard  protocol following bolus administration of intravenous contrast. CONTRAST:  100mL ISOVUE-300 IOPAMIDOL (ISOVUE-300) INJECTION 61% COMPARISON:  None. FINDINGS: Lower chest: The lung bases are clear of acute process. No pleural effusion or pulmonary lesions. The heart is normal in size. No pericardial effusion. The distal esophagus and aorta are unremarkable. Hepatobiliary: No focal hepatic lesions or intrahepatic biliary dilatation. The gallbladder is normal. No common bile duct dilatation. Pancreas: No mass, inflammation or ductal dilatation. Spleen: Normal size.  No focal lesions. Adrenals/Urinary Tract: The adrenal glands and kidneys are unremarkable. The bladder is normal. Stomach/Bowel: The stomach, duodenum and small bowel are unremarkable. No acute inflammatory changes, mass lesions or obstructive findings. Diverticulosis of the sigmoid colon is noted with acute diverticulitis and a large diverticular abscess measuring 5.3 x 3.8 cm. The remainder of the colon is unremarkable except for scattered diverticuli. The appendix is normal. Vascular/Lymphatic: The aorta is normal in caliber. No dissection. The branch vessels are patent. The major venous structures are patent. No mesenteric or retroperitoneal mass or adenopathy. Small scattered lymph nodes are noted. Reproductive: The prostate gland and seminal vesicles are unremarkable. Other: No inguinal mass or adenopathy.  No inguinal hernia. Musculoskeletal: No significant findings. IMPRESSION: 1. Acute diverticulitis involving the sigmoid colon with associated 5 cm diverticular abscess. 2. No other acute abdominal findings and no mass lesions or adenopathy. Electronically Signed   By: Rudie MeyerP.  Gallerani M.D.   On: 01/25/2018 11:12    Labs:  CBC: Recent Labs    01/25/18 0925 01/26/18 0431  WBC 11.3* 8.9  HGB 14.7 13.6  HCT 42.6 40.8  PLT 238 200    COAGS: No results for input(s): INR, APTT in the last 8760 hours.  BMP: Recent Labs    01/25/18 0925  01/26/18 0431  NA 134* 137  K 3.9 3.6  CL 99 102  CO2 22 26  GLUCOSE 116* 87  BUN 11 7  CALCIUM 9.1 8.5*  CREATININE 1.19 1.08  GFRNONAA >60 >60  GFRAA >60 >60    LIVER FUNCTION TESTS: Recent Labs    01/25/18 0925  BILITOT 0.8  AST 15  ALT 13  ALKPHOS 47  PROT 8.7*  ALBUMIN 3.8    TUMOR MARKERS: No results for input(s): AFPTM, CEA, CA199, CHROMGRNA in the last 8760 hours.  Assessment and Plan:  Sigmoid diverticulitis and abscess. Drain to follow.  Thank you for this interesting consult.  I greatly enjoyed meeting Saunders GlanceStevie L Pirie and look forward to participating in their care.  A copy of this report was sent to the requesting provider on this date.  Electronically Signed: Art A Oluwatimilehin Balfour, MD 01/26/2018, 10:37 AM   I spent a total of 40 Minutes  in face to face in clinical  consultation, greater than 50% of which was counseling/coordinating care for abscess drain.

## 2018-01-27 LAB — CBC
HCT: 39.6 % (ref 39.0–52.0)
HEMOGLOBIN: 13.1 g/dL (ref 13.0–17.0)
MCH: 29.2 pg (ref 26.0–34.0)
MCHC: 33.1 g/dL (ref 30.0–36.0)
MCV: 88.2 fL (ref 80.0–100.0)
Platelets: 214 10*3/uL (ref 150–400)
RBC: 4.49 MIL/uL (ref 4.22–5.81)
RDW: 12.4 % (ref 11.5–15.5)
WBC: 6.1 10*3/uL (ref 4.0–10.5)
nRBC: 0 % (ref 0.0–0.2)

## 2018-01-27 LAB — BASIC METABOLIC PANEL
Anion gap: 8 (ref 5–15)
BUN: 8 mg/dL (ref 6–20)
CO2: 27 mmol/L (ref 22–32)
CREATININE: 0.91 mg/dL (ref 0.61–1.24)
Calcium: 8.4 mg/dL — ABNORMAL LOW (ref 8.9–10.3)
Chloride: 100 mmol/L (ref 98–111)
Glucose, Bld: 80 mg/dL (ref 70–99)
Potassium: 3.2 mmol/L — ABNORMAL LOW (ref 3.5–5.1)
SODIUM: 135 mmol/L (ref 135–145)

## 2018-01-27 LAB — HIV ANTIBODY (ROUTINE TESTING W REFLEX): HIV SCREEN 4TH GENERATION: NONREACTIVE

## 2018-01-27 LAB — MAGNESIUM: MAGNESIUM: 1.9 mg/dL (ref 1.7–2.4)

## 2018-01-27 LAB — PHOSPHORUS: PHOSPHORUS: 3.7 mg/dL (ref 2.5–4.6)

## 2018-01-27 MED ORDER — POTASSIUM CHLORIDE CRYS ER 10 MEQ PO TBCR
30.0000 meq | EXTENDED_RELEASE_TABLET | ORAL | Status: AC
  Administered 2018-01-27 (×2): 30 meq via ORAL
  Filled 2018-01-27 (×2): qty 3

## 2018-01-27 MED ORDER — SODIUM CHLORIDE 0.9 % IV SOLN
INTRAVENOUS | Status: DC | PRN
  Administered 2018-01-27: 250 mL via INTRAVENOUS

## 2018-01-27 NOTE — Plan of Care (Signed)

## 2018-01-27 NOTE — Care Management Note (Addendum)
Case Management Note  Patient Details  Name: Steven Wall MRN: [id number] Date of Birth: May 23, 1995  Subjective/Objective:  Medication management and [facility] given. RNCM will assist with discharge medication as needed.                   Action/Plan:   Expected Discharge Date:                  Expected Discharge Plan:     In-House Referral:     Discharge planning Services  [facility], CM Consult, Medication Assistance  Post Acute Care Choice:    Choice offered to:     DME Arranged:    DME Agency:     HH Arranged:    HH Agency:     Status of Service:  In process, will continue to follow  If discussed at Long Length of Stay Meetings, dates discussed:    Additional Comments:  Virgel ManifoldJosh A Ladora Osterberg, RN 01/27/2018, 2:47 PM

## 2018-01-27 NOTE — Progress Notes (Signed)
Subjective:  CC:  Steven Wall is a 22 y.o. male  Hospital stay day 2,   acute diverticulitis  HPI: No issues overnight. Feeling better.  Denies any pain after IR drain placement yesterday.  No flatus or BM  ROS:  A 5 point review of systems was performed and pertinent positives and negatives noted in HPI.   Objective:      Temp:  [97.4 F (36.3 C)-98.6 F (37 C)] 98.6 F (37 C) (11/14 0801) Pulse Rate:  [54-82] 63 (11/14 0801) Resp:  [16-23] 17 (11/14 0801) BP: (119-165)/(66-101) 137/85 (11/14 0801) SpO2:  [99 %-100 %] 100 % (11/14 0801)     Height: 6' (182.9 cm) Weight: 115.7 kg BMI (Calculated): 34.58   Intake/Output this shift:  No intake/output data recorded.   45ml of feculant drainage in drain over past 24hrs    Constitutional :  alert, cooperative, appears stated age and no distress  Respiratory:  clear to auscultation bilaterally  Cardiovascular:  regular rate and rhythm  Gastrointestinal: soft, non-tender; bowel sounds normal; no masses,  no organomegaly.   Skin: Cool and moist.   Psychiatric: Normal affect, non-agitated, not confused       LABS:  CMP Latest Ref Rng & Units 01/27/2018 01/26/2018 01/25/2018  Glucose 70 - 99 mg/dL 80 87 161(W116(H)  BUN 6 - 20 mg/dL 8 7 11   Creatinine 0.61 - 1.24 mg/dL 9.600.91 4.541.08 0.981.19  Sodium 135 - 145 mmol/L 135 137 134(L)  Potassium 3.5 - 5.1 mmol/L 3.2(L) 3.6 3.9  Chloride 98 - 111 mmol/L 100 102 99  CO2 22 - 32 mmol/L 27 26 22   Calcium 8.9 - 10.3 mg/dL 1.1(B8.4(L) 1.4(N8.5(L) 9.1  Total Protein 6.5 - 8.1 g/dL - - 8.7(H)  Total Bilirubin 0.3 - 1.2 mg/dL - - 0.8  Alkaline Phos 38 - 126 U/L - - 47  AST 15 - 41 U/L - - 15  ALT 0 - 44 U/L - - 13   CBC Latest Ref Rng & Units 01/27/2018 01/26/2018 01/25/2018  WBC 4.0 - 10.5 K/uL 6.1 8.9 11.3(H)  Hemoglobin 13.0 - 17.0 g/dL 82.913.1 56.213.6 13.014.7  Hematocrit 39.0 - 52.0 % 39.6 40.8 42.6  Platelets 150 - 400 K/uL 214 200 238    RADS: n/a Assessment:   Acute diverticulitis with  abscess- Doing well, no pain after IR Drainage.  Will advance diet as tolerated and monitor.  Maybe home later today with drain in place if he remains asymptomatic  Hypokalemia- Oral replacement

## 2018-01-28 ENCOUNTER — Other Ambulatory Visit: Payer: Self-pay | Admitting: Surgery

## 2018-01-28 DIAGNOSIS — K5792 Diverticulitis of intestine, part unspecified, without perforation or abscess without bleeding: Secondary | ICD-10-CM

## 2018-01-28 LAB — BASIC METABOLIC PANEL
Anion gap: 9 (ref 5–15)
BUN: 6 mg/dL (ref 6–20)
CALCIUM: 8.3 mg/dL — AB (ref 8.9–10.3)
CO2: 25 mmol/L (ref 22–32)
CREATININE: 0.81 mg/dL (ref 0.61–1.24)
Chloride: 100 mmol/L (ref 98–111)
GFR calc Af Amer: >60 mL/min (ref >60)
GFR calc non Af Amer: >60 mL/min (ref >60)
GLUCOSE: 99 mg/dL (ref 70–99)
Potassium: 3.8 mmol/L (ref 3.5–5.1)
Sodium: 134 mmol/L — ABNORMAL LOW (ref 135–145)

## 2018-01-28 LAB — CBC
HEMATOCRIT: 39 % (ref 39.0–52.0)
HEMOGLOBIN: 13.4 g/dL (ref 13.0–17.0)
MCH: 30 pg (ref 26.0–34.0)
MCHC: 34.4 g/dL (ref 30.0–36.0)
MCV: 87.2 fL (ref 80.0–100.0)
Platelets: 240 10*3/uL (ref 150–400)
RBC: 4.47 MIL/uL (ref 4.22–5.81)
RDW: 12.3 % (ref 11.5–15.5)
WBC: 7.8 10*3/uL (ref 4.0–10.5)
nRBC: 0 % (ref 0.0–0.2)

## 2018-01-28 LAB — MAGNESIUM: Magnesium: 1.7 mg/dL (ref 1.7–2.4)

## 2018-01-28 LAB — PHOSPHORUS: Phosphorus: 3.9 mg/dL (ref 2.5–4.6)

## 2018-01-28 MED ORDER — SODIUM CHLORIDE 0.9% FLUSH: 5.0000 mL | Freq: Three times a day (TID) | INTRAVENOUS | 0 refills | Status: AC

## 2018-01-28 MED ORDER — DOCUSATE SODIUM 100 MG PO CAPS: 100.0000 mg | ORAL_CAPSULE | Freq: Two times a day (BID) | ORAL | 0 refills | Status: AC | PRN

## 2018-01-28 MED ORDER — IBUPROFEN 800 MG PO TABS: 800.0000 mg | ORAL_TABLET | Freq: Three times a day (TID) | ORAL | 0 refills | Status: DC | PRN

## 2018-01-28 MED ORDER — SODIUM CHLORIDE 0.9% FLUSH: 5.0000 mL | Freq: Three times a day (TID) | INTRAVENOUS | 0 refills | Status: DC

## 2018-01-28 MED ORDER — ACETAMINOPHEN 325 MG PO TABS: 650.0000 mg | ORAL_TABLET | Freq: Three times a day (TID) | ORAL | 0 refills | Status: AC | PRN

## 2018-01-28 NOTE — Discharge Summary (Signed)
Physician Discharge Summary  Patient ID: Saunders GlanceStevie L Layfield MRN: [id number] DOB/AGE: [birth year] 22 y.o.  Admit date: 01/25/2018 Discharge date: 01/28/2018  Admission Diagnoses: acute diverticuilitis  Discharge Diagnoses:  Same as above  Discharged Condition: good  Hospital Course: admitted for perforated diverticulitis, contained.  IR drain placed, advanced diet as tolerated.  No pain or leukocytosis on discharge.  Will be d/c with drain in place.  Consults: IR  Discharge Exam: Blood pressure (!) 140/92, pulse 65, temperature 98 F (36.7 C), temperature source Oral, resp. rate 12, height 6' (1.829 m), weight 115.7 kg, SpO2 100 %. General appearance: alert, cooperative and no distress Resp: clear to auscultation bilaterally Cardio: regular rate and rhythm GI: soft, non-tender; bowel sounds normal; no masses,  no organomegaly and IR drain in place, still with some purulent discharge  Disposition:  Discharge disposition: 01-Home or Self Care       Discharge Instructions    Discharge patient   Complete by:  As directed    Discharge disposition:  01-Home or Self Care   Discharge patient date:  01/28/2018     Allergies as of 01/28/2018   No Known Allergies     Medication List    TAKE these medications   acetaminophen 325 MG tablet Commonly known as:  TYLENOL Take 2 tablets (650 mg total) by mouth every 8 (eight) hours as needed for mild pain.   ciprofloxacin 500 MG tablet Commonly known as:  CIPRO Take 1 tablet (500 mg total) by mouth 2 (two) times daily for 10 days.   docusate sodium 100 MG capsule Commonly known as:  COLACE Take 1 capsule (100 mg total) by mouth 2 (two) times daily as needed for up to 10 days for mild constipation.   ibuprofen 800 MG tablet Commonly known as:  ADVIL,MOTRIN Take 1 tablet (800 mg total) by mouth every 8 (eight) hours as needed for mild pain or moderate pain.   metroNIDAZOLE 500 MG tablet Commonly known as:  FLAGYL Take 1  tablet (500 mg total) by mouth 3 (three) times daily for 10 days.   ondansetron 8 MG disintegrating tablet Commonly known as:  ZOFRAN-ODT Take 1 tablet (8 mg total) by mouth every 8 (eight) hours as needed for nausea or vomiting.   sodium chloride flush 0.9 % Soln Commonly known as:  NS 5 mLs by Intracatheter route every 8 (eight) hours for 14 days.   triamcinolone cream 0.1 % Commonly known as:  KENALOG Apply 1 application topically 2 (two) times daily.      Follow-up Information    Tonna BoehringerSakai, Exander Shaul, DO Follow up in 2 week(s).   Specialty:  Surgery Contact information: [address] [place] [address] [phone]        [facility] Follow up in 1 week(s).   Why:  for drain care and possible removal Contact information: [address] [place] [address] [phone]        [facility] .   Contact information: [address] [place] [phone]           Total time spent arranging discharge was >1730min. Signed: Sung Amabilesami Jomarie Gellis 01/28/2018, 8:17 AM

## 2018-01-28 NOTE — Care Management Note (Signed)
Case Management Note  Patient Details  Name: Steven Wall MRN: [id number] Date of Birth: [birth year]   Patient provided coupons from [email or web address] for all medications.  Sister to transport at discharge and confirms she will be picking up his medications after discharge. Previous RNCM has given [facility] , and Medication Management  Applications earlier this week.    Subjective/Objective:                    Action/Plan:   Expected Discharge Date:  01/28/18               Expected Discharge Plan:     In-House Referral:     Discharge planning Services  [facility], CM Consult, Medication Assistance  Post Acute Care Choice:    Choice offered to:     DME Arranged:    DME Agency:     HH Arranged:    HH Agency:     Status of Service:  Completed, signed off  If discussed at [employer] Length of [employer] Meetings, dates discussed:    Additional Comments:  Chapman FitchBOWEN, Jamayia Croker T, RN 01/28/2018, 5:18 PM

## 2018-01-31 LAB — AEROBIC/ANAEROBIC CULTURE (SURGICAL/DEEP WOUND)

## 2018-01-31 LAB — AEROBIC/ANAEROBIC CULTURE W GRAM STAIN (SURGICAL/DEEP WOUND)

## 2018-02-08 ENCOUNTER — Ambulatory Visit
Admission: RE | Admit: 2018-02-08 | Discharge: 2018-02-08 | Disposition: A | Discharge status: Home / Self Care | Payer: Self-pay | Source: Ambulatory Visit | Attending: Surgery | Admitting: Surgery

## 2018-02-08 ENCOUNTER — Other Ambulatory Visit: Payer: Self-pay | Admitting: Surgery

## 2018-02-08 ENCOUNTER — Encounter: Payer: Self-pay | Admitting: Radiology

## 2018-02-08 DIAGNOSIS — K5792 Diverticulitis of intestine, part unspecified, without perforation or abscess without bleeding: Secondary | ICD-10-CM

## 2018-02-08 HISTORY — PX: IR RADIOLOGIST EVAL & MGMT: IMG5224

## 2018-02-08 MED ORDER — IOHEXOL 300 MG/ML  SOLN
100.0000 mL | Freq: Once | INTRAMUSCULAR | Status: AC | PRN
  Administered 2018-02-08: 100 mL via INTRAVENOUS

## 2018-02-08 NOTE — Progress Notes (Signed)
Chief Complaint: Patient was seen in consultation today for follow up of abdominal abscess drain  Referring Physician(s): Sakai,Isami  Supervising Physician: Gilmer Mor  History of Present Illness: Steven Wall is a 22 y.o. male with no significant past medical history who presented to [facility] ED on 01/25/18 with complaints of LLQ abdominal pain x 8-9 days associated with nausea and vomiting. He was found to have acute diverticulitis involving the sigmoid colon with associated 5 cm diverticular abscess. He was admitted for further evaluation and management. General surgery was consulted and as patient only displayed focal peritonitis on exam with stable vital signs and no evidence of uncontained perforation IR was consulted for drain placement which was successfully performed by Dr. Bonnielee Haff on 01/26/18. He was discharged to home in stable condition on 11/15. Patient presents to IR clinic for CT abdomen/pelvis and possible drain injection.  Mr. Armor presents with his sister today, he denies any complaints - his appetite has been good, no abdominal pain or changes in bowel movements. He continues to flush his drain with 5 cc NS daily and has had <20 cc daily OP since shortly after leaving the hospital. He denies noting an leakage of fluid from insertion site during flushes, discharge or increased redness. He states that the output always appears to be milky tan in color.   No past medical history on file.  No past surgical history on file.  Allergies: Patient has no known allergies.  Medications: Prior to Admission medications   Medication Sig Start Date End Date Taking? Authorizing Provider  acetaminophen (TYLENOL) 325 MG tablet Take 2 tablets (650 mg total) by mouth every 8 (eight) hours as needed for mild pain. 01/28/18 02/27/18  Tonna Boehringer, Isami, DO  ibuprofen (ADVIL,MOTRIN) 800 MG tablet Take 1 tablet (800 mg total) by mouth every 8 (eight) hours as needed for mild pain or moderate  pain. 01/28/18   Sakai, Isami, DO  ondansetron (ZOFRAN ODT) 8 MG disintegrating tablet Take 1 tablet (8 mg total) by mouth every 8 (eight) hours as needed for nausea or vomiting. 01/25/18   Dionne Bucy, MD  sodium chloride flush (NS) 0.9 % SOLN 5 mLs by Intracatheter route every 8 (eight) hours for 14 days. 01/28/18 02/11/18  Tonna Boehringer, Isami, DO  triamcinolone cream (KENALOG) 0.1 % Apply 1 application topically 2 (two) times daily. Patient not taking: Reported on 01/25/2018 04/10/16   Linna Hoff, MD     No family history on file.  Social History   Socioeconomic History  . Marital status: Single    Spouse name: Not on file  . Number of children: Not on file  . Years of education: Not on file  . Highest education level: Not on file  Occupational History  . Not on file  Social Needs  . Financial resource strain: Not on file  . Food insecurity:    Worry: Not on file    Inability: Not on file  . Transportation needs:    Medical: Not on file    Non-medical: Not on file  Tobacco Use  . Smoking status: Never Smoker  . Smokeless tobacco: Never Used  Substance and Sexual Activity  . Alcohol use: Yes    Comment: occ  . Drug use: No  . Sexual activity: Not on file  Lifestyle  . Physical activity:    Days per week: Not on file    Minutes per session: Not on file  . Stress: Not on file  Relationships  .  Social connections:    Talks on phone: Not on file    Gets together: Not on file    Attends religious service: Not on file    Active member of club or organization: Not on file    Attends meetings of clubs or organizations: Not on file    Relationship status: Not on file  Other Topics Concern  . Not on file  Social History Narrative  . Not on file     Review of Systems: A 12 point ROS discussed and pertinent positives are indicated in the HPI above.  All other systems are negative.  Review of Systems  Constitutional: Negative for appetite change, chills, fatigue and  fever.  Respiratory: Negative for shortness of breath.   Cardiovascular: Negative for chest pain.  Gastrointestinal: Negative for abdominal pain, blood in stool, constipation, diarrhea, nausea and vomiting.  Skin: Negative for rash and wound.    Vital Signs: There were no vitals taken for this visit.  Physical Exam  Constitutional: No distress.  HENT:  Head: Normocephalic.  Pulmonary/Chest: Effort normal.  Abdominal: Soft. He exhibits no distension. There is no tenderness.  LLQ abdominal drain in place - insertion site clean, dry, intact without bleeding, discharge, erythema or edema. Drain injected today without leakage of fluid from insertion site.   Neurological: He is alert.  Skin: Skin is warm and dry. He is not diaphoretic.  Psychiatric: He has a normal mood and affect. His behavior is normal.    Imaging: Ct Abdomen Pelvis W Contrast  Result Date: 02/08/2018 CLINICAL DATA:  22 year old male with a history diverticular abscess. Drainage performed 01/26/2018. EXAM: CT ABDOMEN AND PELVIS WITH CONTRAST TECHNIQUE: Multidetector CT imaging of the abdomen and pelvis was performed using the standard protocol following bolus administration of intravenous contrast. CONTRAST:  OMNIPAQUE IOHEXOL 300 MG/ML  SOLN COMPARISON:  01/25/2018 FINDINGS: Lower chest: No acute finding Hepatobiliary: Unremarkable liver.  Unremarkable gallbladder Pancreas: Unremarkable pancreas Spleen: Unremarkable spleen Adrenals/Urinary Tract: Unremarkable appearance of the adrenal glands. No evidence of hydronephrosis of the right or left kidney. No nephrolithiasis. Unremarkable course of the bilateral ureters. Unremarkable appearance of the urinary bladder. Small hypodense lesion of the left kidney which is too small to characterize. Stomach/Bowel: Stomach unremarkable. Small bowel unremarkable. No transition point. No focal wall thickening. Normal appendix. Complete resolution of the inflammatory changes/abscess  adjacent to sigmoid colon, with unchanged position of the pigtail drainage catheter. Vascular/Lymphatic: No atherosclerotic changes. Small lymph nodes in the retroperitoneum, none of which are enlarged. Lymph nodes in the bilateral inguinal region, unchanged from the prior CT. Reproductive: Unremarkable pelvic structures Other: None Musculoskeletal: No acute displaced fracture. No significant degenerative changes. IMPRESSION: Complete resolution pericolonic abscess at the sigmoid colon with pigtail drainage catheter remaining in position. There has been partial un curling/withdrawal. Electronically Signed   By: Gilmer Mor D.O.   On: 02/08/2018 14:27   Ct Abdomen Pelvis W Contrast  Result Date: 01/25/2018 CLINICAL DATA:  Lower abdominal pain for 1 week.  Fever and chills. EXAM: CT ABDOMEN AND PELVIS WITH CONTRAST TECHNIQUE: Multidetector CT imaging of the abdomen and pelvis was performed using the standard protocol following bolus administration of intravenous contrast. CONTRAST:  ISOVUE-300 IOPAMIDOL (ISOVUE-300) INJECTION 61% COMPARISON:  None. FINDINGS: Lower chest: The lung bases are clear of acute process. No pleural effusion or pulmonary lesions. The heart is normal in size. No pericardial effusion. The distal esophagus and aorta are unremarkable. Hepatobiliary: No focal hepatic lesions or intrahepatic biliary dilatation. The  gallbladder is normal. No common bile duct dilatation. Pancreas: No mass, inflammation or ductal dilatation. Spleen: Normal size.  No focal lesions. Adrenals/Urinary Tract: The adrenal glands and kidneys are unremarkable. The bladder is normal. Stomach/Bowel: The stomach, duodenum and small bowel are unremarkable. No acute inflammatory changes, mass lesions or obstructive findings. Diverticulosis of the sigmoid colon is noted with acute diverticulitis and a large diverticular abscess measuring 5.3 x 3.8 cm. The remainder of the colon is unremarkable except for scattered  diverticuli. The appendix is normal. Vascular/Lymphatic: The aorta is normal in caliber. No dissection. The branch vessels are patent. The major venous structures are patent. No mesenteric or retroperitoneal mass or adenopathy. Small scattered lymph nodes are noted. Reproductive: The prostate gland and seminal vesicles are unremarkable. Other: No inguinal mass or adenopathy.  No inguinal hernia. Musculoskeletal: No significant findings. IMPRESSION: 1. Acute diverticulitis involving the sigmoid colon with associated 5 cm diverticular abscess. 2. No other acute abdominal findings and no mass lesions or adenopathy. Electronically Signed   By: Rudie Meyer M.D.   On: 01/25/2018 11:12   Ct Image Guided Drainage By Percutaneous Catheter  Result Date: 01/26/2018 INDICATION: Abdominal abscess EXAM: CT GUIDED DRAINAGE OF ABDOMINAL ABSCESS MEDICATIONS: The patient is currently admitted to the hospital and receiving intravenous antibiotics. The antibiotics were administered within an appropriate time frame prior to the initiation of the procedure. ANESTHESIA/SEDATION: Three mg IV Versed 150 mcg IV Fentanyl Moderate Sedation Time:  23 minutes The patient was continuously monitored during the procedure by the interventional radiology nurse under my direct supervision. COMPLICATIONS: None immediate. TECHNIQUE: Informed written consent was obtained from the patient after a thorough discussion of the procedural risks, benefits and alternatives. All questions were addressed. Maximal Sterile Barrier Technique was utilized including caps, mask, sterile gowns, sterile gloves, sterile drape, hand hygiene and skin antiseptic. A timeout was performed prior to the initiation of the procedure. PROCEDURE: The abdomen was prepped with ChloraPrep in a sterile fashion, and a sterile drape was applied covering the operative field. A sterile gown and sterile gloves were used for the procedure. Local anesthesia was provided with 1%  Lidocaine. Under CT guidance, an 18 gauge needle was advanced into the abdominal abscess via anterior approach. It was removed over an Amplatz wire. Ten [place] dilator followed by a 10 [place] drain were inserted. It was looped and string fixed then sewn to the skin with 0 Prolene. Pus was aspirated. FINDINGS: Images document placement of a 10 French drain in an abdominal abscess. IMPRESSION: Successful 10 French abdominal abscess drain placement. Electronically Signed   By: Jolaine Click M.D.   On: 01/26/2018 12:01    Labs:  CBC: Recent Labs    01/25/18 0925 01/26/18 0431 01/27/18 0317 01/28/18 0453  WBC 11.3* 8.9 6.1 7.8  HGB 14.7 13.6 13.1 13.4  HCT 42.6 40.8 39.6 39.0  PLT 238 200 214 240    COAGS: No results for input(s): INR, APTT in the last 8760 hours.  BMP: Recent Labs    01/25/18 0925 01/26/18 0431 01/27/18 0317 01/28/18 0453  NA 134* 137 135 134*  K 3.9 3.6 3.2* 3.8  CL 99 102 100 100  CO2 22 26 27 25   GLUCOSE 116* 87 80 99  BUN 11 7 8 6   CALCIUM 9.1 8.5* 8.4* 8.3*  CREATININE 1.19 1.08 0.91 0.81  GFRNONAA >60 >60 >60 >60  GFRAA >60 >60 >60 >60    LIVER FUNCTION TESTS: Recent Labs    01/25/18 0925  BILITOT 0.8  AST 15  ALT 13  ALKPHOS 47  PROT 8.7*  ALBUMIN 3.8    TUMOR MARKERS: No results for input(s): AFPTM, CEA, CA199, CHROMGRNA in the last 8760 hours.  Assessment:  Patient s/p successful abdominal abscess drain placement by Dr. Bonnielee HaffHoss on 11/13 who presents for CT abdomen/pelvis and drain injection today in IR clinic. He has not had any issues with his drain, is overall feeling well and appetite is at baseline. He continue to take PO antibiotics and denies any fevers or chills. He is scheduled to see general surgery on 12/2 to discuss further management of his diverticulitis and abdominal abscess.  CT abdomen/pelvis today shows complete resolution of the pericolonic abscess at the sigmoid color with pigtail drainage catheter remaining in position  with partial un curling/withdrawal. Drain injection unfortunately shows colonic fistula. Images reviewed with Dr. Loreta AveWagner who recommends to discontinue flushing, keep the drain to gravity and follow up in IR clinic for injection only on 12/10.  Patient encouraged to keep all follow up appointments and to call IR with any questions or concerns.   Electronically Signed: Villa HerbShannon A Ozella Comins PA-C 02/08/2018, 2:48 PM   Please refer to Dr. Kenna GilbertWagner's attestation of this note for management and plan.

## 2018-02-22 ENCOUNTER — Ambulatory Visit
Admission: RE | Admit: 2018-02-22 | Discharge: 2018-02-22 | Disposition: A | Discharge status: Home / Self Care | Payer: Self-pay | Source: Ambulatory Visit | Attending: Surgery | Admitting: Surgery

## 2018-02-22 ENCOUNTER — Encounter: Payer: Self-pay | Admitting: Radiology

## 2018-02-22 DIAGNOSIS — K5792 Diverticulitis of intestine, part unspecified, without perforation or abscess without bleeding: Secondary | ICD-10-CM

## 2018-02-22 HISTORY — PX: IR RADIOLOGIST EVAL & MGMT: IMG5224

## 2018-02-22 NOTE — Progress Notes (Signed)
Chief Complaint: Patient was seen in consultation today for follow up of abdominal abscess drain  Referring Physician(s): Sakai,Isami  Supervising Physician: Dr. Fredia SorrowYamagata  History of Present Illness: Steven Wall is a 22 y.o. male following up for diverticular abscess with known fistula. We last saw him on 11/26. He states that the output continues to be milky tan in color.  Denies pain, N/V Good appetite and bowels moving well. He has been seen by Dr. Tonna BoehringerSakai and is supposed to call him after today's evaluation.   No past medical history on file.  Past Surgical History:  Procedure Laterality Date  . IR RADIOLOGIST EVAL & MGMT  02/08/2018    Allergies: Patient has no known allergies.  Medications: Prior to Admission medications   Medication Sig Start Date End Date Taking? Authorizing Provider  acetaminophen (TYLENOL) 325 MG tablet Take 2 tablets (650 mg total) by mouth every 8 (eight) hours as needed for mild pain. 01/28/18 02/27/18  Tonna BoehringerSakai, Isami, DO  ibuprofen (ADVIL,MOTRIN) 800 MG tablet Take 1 tablet (800 mg total) by mouth every 8 (eight) hours as needed for mild pain or moderate pain. 01/28/18   Sakai, Isami, DO  ondansetron (ZOFRAN ODT) 8 MG disintegrating tablet Take 1 tablet (8 mg total) by mouth every 8 (eight) hours as needed for nausea or vomiting. 01/25/18   Dionne BucySiadecki, Sebastian, MD  sodium chloride flush (NS) 0.9 % SOLN 5 mLs by Intracatheter route every 8 (eight) hours for 14 days. 01/28/18 02/11/18  Tonna BoehringerSakai, Isami, DO  triamcinolone cream (KENALOG) 0.1 % Apply 1 application topically 2 (two) times daily. Patient not taking: Reported on 01/25/2018 04/10/16   Linna HoffKindl, James D, MD     No family history on file.  Social History   Socioeconomic History  . Marital status: Single    Spouse name: Not on file  . Number of children: Not on file  . Years of education: Not on file  . Highest education level: Not on file  Occupational History  . Not on file    Social Needs  . Financial resource strain: Not on file  . Food insecurity:    Worry: Not on file    Inability: Not on file  . Transportation needs:    Medical: Not on file    Non-medical: Not on file  Tobacco Use  . Smoking status: Never Smoker  . Smokeless tobacco: Never Used  Substance and Sexual Activity  . Alcohol use: Yes    Comment: occ  . Drug use: No  . Sexual activity: Not on file  Lifestyle  . Physical activity:    Days per week: Not on file    Minutes per session: Not on file  . Stress: Not on file  Relationships  . Social connections:    Talks on phone: Not on file    Gets together: Not on file    Attends religious service: Not on file    Active member of club or organization: Not on file    Attends meetings of clubs or organizations: Not on file    Relationship status: Not on file  Other Topics Concern  . Not on file  Social History Narrative  . Not on file     Review of Systems: A 12 point ROS discussed and pertinent positives are indicated in the HPI above.  All other systems are negative.  Review of Systems  Constitutional: Negative for appetite change, chills, fatigue and fever.  Respiratory: Negative for shortness of breath.  Cardiovascular: Negative for chest pain.  Gastrointestinal: Negative for abdominal pain, blood in stool, constipation, diarrhea, nausea and vomiting.  Skin: Negative for rash and wound.    Vital Signs: BP (!) 158/89   Pulse 93   Temp 98.2 F (36.8 C)   SpO2 99%   Physical Exam  Constitutional: No distress.  HENT:  Head: Normocephalic.  Pulmonary/Chest: Effort normal.  Abdominal: Soft. He exhibits no distension. There is no tenderness.  LLQ abdominal drain in place - insertion site clean, dry, intact without bleeding, discharge, erythema or edema. Drain injected today without leakage of fluid from insertion site.   Neurological: He is alert.  Skin: Skin is warm and dry. He is not diaphoretic.  Psychiatric: He has  a normal mood and affect. His behavior is normal.    Imaging: Ct Abdomen Pelvis W Contrast  Result Date: 02/08/2018 CLINICAL DATA:  22 year old male with a history diverticular abscess. Drainage performed 01/26/2018. EXAM: CT ABDOMEN AND PELVIS WITH CONTRAST TECHNIQUE: Multidetector CT imaging of the abdomen and pelvis was performed using the standard protocol following bolus administration of intravenous contrast. CONTRAST:  OMNIPAQUE IOHEXOL 300 MG/ML  SOLN COMPARISON:  01/25/2018 FINDINGS: Lower chest: No acute finding Hepatobiliary: Unremarkable liver.  Unremarkable gallbladder Pancreas: Unremarkable pancreas Spleen: Unremarkable spleen Adrenals/Urinary Tract: Unremarkable appearance of the adrenal glands. No evidence of hydronephrosis of the right or left kidney. No nephrolithiasis. Unremarkable course of the bilateral ureters. Unremarkable appearance of the urinary bladder. Small hypodense lesion of the left kidney which is too small to characterize. Stomach/Bowel: Stomach unremarkable. Small bowel unremarkable. No transition point. No focal wall thickening. Normal appendix. Complete resolution of the inflammatory changes/abscess adjacent to sigmoid colon, with unchanged position of the pigtail drainage catheter. Vascular/Lymphatic: No atherosclerotic changes. Small lymph nodes in the retroperitoneum, none of which are enlarged. Lymph nodes in the bilateral inguinal region, unchanged from the prior CT. Reproductive: Unremarkable pelvic structures Other: None Musculoskeletal: No acute displaced fracture. No significant degenerative changes. IMPRESSION: Complete resolution pericolonic abscess at the sigmoid colon with pigtail drainage catheter remaining in position. There has been partial un curling/withdrawal. Electronically Signed   By: Gilmer Mor D.O.   On: 02/08/2018 14:27   Ct Abdomen Pelvis W Contrast  Result Date: 01/25/2018 CLINICAL DATA:  Lower abdominal pain for 1 week.  Fever and  chills. EXAM: CT ABDOMEN AND PELVIS WITH CONTRAST TECHNIQUE: Multidetector CT imaging of the abdomen and pelvis was performed using the standard protocol following bolus administration of intravenous contrast. CONTRAST:  ISOVUE-300 IOPAMIDOL (ISOVUE-300) INJECTION 61% COMPARISON:  None. FINDINGS: Lower chest: The lung bases are clear of acute process. No pleural effusion or pulmonary lesions. The heart is normal in size. No pericardial effusion. The distal esophagus and aorta are unremarkable. Hepatobiliary: No focal hepatic lesions or intrahepatic biliary dilatation. The gallbladder is normal. No common bile duct dilatation. Pancreas: No mass, inflammation or ductal dilatation. Spleen: Normal size.  No focal lesions. Adrenals/Urinary Tract: The adrenal glands and kidneys are unremarkable. The bladder is normal. Stomach/Bowel: The stomach, duodenum and small bowel are unremarkable. No acute inflammatory changes, mass lesions or obstructive findings. Diverticulosis of the sigmoid colon is noted with acute diverticulitis and a large diverticular abscess measuring 5.3 x 3.8 cm. The remainder of the colon is unremarkable except for scattered diverticuli. The appendix is normal. Vascular/Lymphatic: The aorta is normal in caliber. No dissection. The branch vessels are patent. The major venous structures are patent. No mesenteric or retroperitoneal mass or adenopathy. Small scattered  lymph nodes are noted. Reproductive: The prostate gland and seminal vesicles are unremarkable. Other: No inguinal mass or adenopathy.  No inguinal hernia. Musculoskeletal: No significant findings. IMPRESSION: 1. Acute diverticulitis involving the sigmoid colon with associated 5 cm diverticular abscess. 2. No other acute abdominal findings and no mass lesions or adenopathy. Electronically Signed   By: Rudie Meyer M.D.   On: 01/25/2018 11:12   Dg Sinus/fist Tube Chk-non Gi  Result Date: 02/08/2018 INDICATION: 22 year old male with a  history of diverticular abscess EXAM: EXISTING DRAIN INJECTION MEDICATIONS: None ANESTHESIA/SEDATION: None COMPLICATIONS: None PROCEDURE: Informed written consent was obtained from the patient after a thorough discussion of the procedural risks, benefits and alternatives. All questions were addressed. Maximal Sterile Barrier Technique was utilized including caps, mask, sterile gowns, sterile gloves, sterile drape, hand hygiene and skin antiseptic. A timeout was performed prior to the initiation of the procedure. Clean technique was used to inject the existing drain after scout images were acquired. Images were acquired during the drain injection with images stored sent to PACs. The drain was then attached to gravity drainage. FINDINGS: Fistulous connection with the sigmoid colon confirmed on drain injection. IMPRESSION: Drain injection confirms a fistulous connection with the sigmoid colon and no significant abscess cavity. Electronically Signed   By: Gilmer Mor D.O.   On: 02/08/2018 15:57   Ct Image Guided Drainage By Percutaneous Catheter  Result Date: 01/26/2018 INDICATION: Abdominal abscess EXAM: CT GUIDED DRAINAGE OF ABDOMINAL ABSCESS MEDICATIONS: The patient is currently admitted to the hospital and receiving intravenous antibiotics. The antibiotics were administered within an appropriate time frame prior to the initiation of the procedure. ANESTHESIA/SEDATION: Three mg IV Versed 150 mcg IV Fentanyl Moderate Sedation Time:  23 minutes The patient was continuously monitored during the procedure by the interventional radiology nurse under my direct supervision. COMPLICATIONS: None immediate. TECHNIQUE: Informed written consent was obtained from the patient after a thorough discussion of the procedural risks, benefits and alternatives. All questions were addressed. Maximal Sterile Barrier Technique was utilized including caps, mask, sterile gowns, sterile gloves, sterile drape, hand hygiene and skin  antiseptic. A timeout was performed prior to the initiation of the procedure. PROCEDURE: The abdomen was prepped with ChloraPrep in a sterile fashion, and a sterile drape was applied covering the operative field. A sterile gown and sterile gloves were used for the procedure. Local anesthesia was provided with 1% Lidocaine. Under CT guidance, an 18 gauge needle was advanced into the abdominal abscess via anterior approach. It was removed over an Amplatz wire. Ten [place] dilator followed by a 10 [place] drain were inserted. It was looped and string fixed then sewn to the skin with 0 Prolene. Pus was aspirated. FINDINGS: Images document placement of a 10 French drain in an abdominal abscess. IMPRESSION: Successful 10 French abdominal abscess drain placement. Electronically Signed   By: Jolaine Click M.D.   On: 01/26/2018 12:01   Ir Radiologist Eval & Mgmt  Result Date: 02/08/2018 Please refer to notes tab for details about interventional procedure. (Op Note)   Labs:  CBC: Recent Labs    01/25/18 0925 01/26/18 0431 01/27/18 0317 01/28/18 0453  WBC 11.3* 8.9 6.1 7.8  HGB 14.7 13.6 13.1 13.4  HCT 42.6 40.8 39.6 39.0  PLT 238 200 214 240    COAGS: No results for input(s): INR, APTT in the last 8760 hours.  BMP: Recent Labs    01/25/18 0925 01/26/18 0431 01/27/18 0317 01/28/18 0453  NA 134* 137 135 134*  K  3.9 3.6 3.2* 3.8  CL 99 102 100 100  CO2 22 26 27 25   GLUCOSE 116* 87 80 99  BUN 11 7 8 6   CALCIUM 9.1 8.5* 8.4* 8.3*  CREATININE 1.19 1.08 0.91 0.81  GFRNONAA >60 >60 >60 >60  GFRAA >60 >60 >60 >60    LIVER FUNCTION TESTS: Recent Labs    01/25/18 0925  BILITOT 0.8  AST 15  ALT 13  ALKPHOS 47  PROT 8.7*  ALBUMIN 3.8    TUMOR MARKERS: No results for input(s): AFPTM, CEA, CA199, CHROMGRNA in the last 8760 hours.  Assessment: Diverticular abscess s/p perc drain on 11/13 Follow up drain injection today again shows patent fistula to the colon. Pt will continue same  drain care/instructions. He will follow up with his surgeon. We would see him back for repeat drain injection in 2 weeks if surgery is not planned.   Electronically Signed: Brayton El PA-C 02/22/2018, 1:05 PM

## 2018-03-05 ENCOUNTER — Emergency Department
Admission: EM | Admit: 2018-03-05 | Discharge: 2018-03-05 | Disposition: A | Discharge status: Home / Self Care | Payer: Self-pay | Attending: Emergency Medicine | Admitting: Emergency Medicine

## 2018-03-05 ENCOUNTER — Other Ambulatory Visit: Payer: Self-pay

## 2018-03-05 DIAGNOSIS — L0390 Cellulitis, unspecified: Secondary | ICD-10-CM | POA: Diagnosis present

## 2018-03-05 DIAGNOSIS — T148XXA Other injury of unspecified body region, initial encounter: Secondary | ICD-10-CM

## 2018-03-05 DIAGNOSIS — L24A9 Irritant contact dermatitis due friction or contact with other specified body fluids: Secondary | ICD-10-CM

## 2018-03-05 DIAGNOSIS — T8189XA Other complications of procedures, not elsewhere classified, initial encounter: Secondary | ICD-10-CM | POA: Insufficient documentation

## 2018-03-05 DIAGNOSIS — Y828 Other medical devices associated with adverse incidents: Secondary | ICD-10-CM | POA: Insufficient documentation

## 2018-03-05 LAB — COMPREHENSIVE METABOLIC PANEL
ALT: 14 U/L (ref 0–44)
AST: 24 U/L (ref 15–41)
Albumin: 3.8 g/dL (ref 3.5–5.0)
Alkaline Phosphatase: 41 U/L (ref 38–126)
Anion gap: 9 (ref 5–15)
BUN: 12 mg/dL (ref 6–20)
CALCIUM: 8.7 mg/dL — AB (ref 8.9–10.3)
CO2: 24 mmol/L (ref 22–32)
CREATININE: 1.02 mg/dL (ref 0.61–1.24)
Chloride: 106 mmol/L (ref 98–111)
GFR calc Af Amer: >60 mL/min (ref >60)
GFR calc non Af Amer: >60 mL/min (ref >60)
Glucose, Bld: 106 mg/dL — ABNORMAL HIGH (ref 70–99)
Potassium: 3.7 mmol/L (ref 3.5–5.1)
Sodium: 139 mmol/L (ref 135–145)
Total Bilirubin: 0.7 mg/dL (ref 0.3–1.2)
Total Protein: 7.6 g/dL (ref 6.5–8.1)

## 2018-03-05 LAB — CBC
HCT: 42.9 % (ref 39.0–52.0)
Hemoglobin: 14.2 g/dL (ref 13.0–17.0)
MCH: 29.2 pg (ref 26.0–34.0)
MCHC: 33.1 g/dL (ref 30.0–36.0)
MCV: 88.3 fL (ref 80.0–100.0)
PLATELETS: 224 10*3/uL (ref 150–400)
RBC: 4.86 MIL/uL (ref 4.22–5.81)
RDW: 13.8 % (ref 11.5–15.5)
WBC: 5.9 10*3/uL (ref 4.0–10.5)
nRBC: 0 % (ref 0.0–0.2)

## 2018-03-05 LAB — LIPASE, BLOOD: Lipase: 46 U/L (ref 11–51)

## 2018-03-05 MED ORDER — CEPHALEXIN 500 MG PO CAPS: 500.0000 mg | ORAL_CAPSULE | Freq: Three times a day (TID) | ORAL | 0 refills | Status: AC

## 2018-03-05 NOTE — ED Provider Notes (Signed)
[facility] Emergency Department Provider Note  ____________________________________________   I have reviewed the triage vital signs and the nursing notes. Where available I have reviewed prior notes and, if possible and indicated, outside hospital notes.    HISTORY  Chief Complaint Abdominal Pain and Post-op Problem    HPI Steven Wall is a 22 y.o. male who was discharged on November 15 after having a drain placed for abscess drainage from a diverticular pathology.  The drain is been in place since then.  He apparently did have a small fistula.  The drainage was getting greatly reduced and he has been allowed he states by his surgeon, to the drain.  He is making good bowel movements he is eating and drinking with no difficulty has no fever, he did notice however a fibrinous exudate looking stuff around the itself and it is slightly tender.  The skin is slightly tender and on his abdomen.  This is been going on for day or 2.  Patient would also like a work note for an indefinite period of time.  Last surgeons note suggest the patient would be allowed to go back to work.  Patient has had no fever no vomiting, he is scheduled to see his surgeon this coming week he states.  However, he was concerned that there was some exudate from around the fistula site.   History reviewed. No pertinent past medical history.  Patient Active Problem List   Diagnosis Date Noted  . Diverticulitis large intestine 01/25/2018    Past Surgical History:  Procedure Laterality Date  . IR RADIOLOGIST EVAL & MGMT  02/08/2018  . IR RADIOLOGIST EVAL & MGMT  02/22/2018    Prior to Admission medications   Medication Sig Start Date End Date Taking? Authorizing Provider  ibuprofen (ADVIL,MOTRIN) 800 MG tablet Take 1 tablet (800 mg total) by mouth every 8 (eight) hours as needed for mild pain or moderate pain. 01/28/18   Sakai, Isami, DO  ondansetron (ZOFRAN ODT) 8 MG disintegrating tablet  Take 1 tablet (8 mg total) by mouth every 8 (eight) hours as needed for nausea or vomiting. 01/25/18   Dionne BucySiadecki, Sebastian, MD  triamcinolone cream (KENALOG) 0.1 % Apply 1 application topically 2 (two) times daily. Patient not taking: Reported on 01/25/2018 04/10/16   Linna HoffKindl, Lillian Ballester D, MD    Allergies Patient has no known allergies.  History reviewed. No pertinent family history.  Social History Social History   Tobacco Use  . Smoking status: Never Smoker  . Smokeless tobacco: Never Used  Substance Use Topics  . Alcohol use: Yes    Comment: occ  . Drug use: No    Review of Systems Constitutional: No fever/chills Eyes: No visual changes. ENT: No sore throat. No stiff neck no neck pain Cardiovascular: Denies chest pain. Respiratory: Denies shortness of breath. Gastrointestinal:   no vomiting.  No diarrhea.  No constipation. Genitourinary: Negative for dysuria. Musculoskeletal: Negative lower extremity swelling Skin: Negative for rash. Neurological: Negative for severe headaches, focal weakness or numbness.   ____________________________________________   PHYSICAL EXAM:  VITAL SIGNS: ED Triage Vitals  Enc Vitals Group     BP 03/05/18 1103 (!) 152/80     Pulse Rate 03/05/18 1103 84     Resp 03/05/18 1103 16     Temp 03/05/18 1103 98.6 F (37 C)     Temp Source 03/05/18 1103 Oral     SpO2 03/05/18 1103 98 %     Weight 03/05/18 1104 260  lb (117.9 kg)     Height 03/05/18 1104 6\' 1"  (1.854 m)     Head Circumference --      Peak Flow --      Pain Score 03/05/18 1104 3     Pain Loc --      Pain Edu? --      Excl. in GC? --     Constitutional: Alert and oriented. Well appearing and in no acute distress. Eyes: Conjunctivae are normal Head: Atraumatic HEENT: No congestion/rhinnorhea. Mucous membranes are moist.  Oropharynx non-erythematous Neck:   Nontender with no meningismus, no masses, no stridor Cardiovascular: Normal rate, regular rhythm. Grossly normal heart  sounds.  Good peripheral circulation. Respiratory: Normal respiratory effort.  No retractions. Lungs CTAB. Abdominal: Soft and nontender. No distention. No guarding no rebound there is a grayish fibrinous exudate noted from the dressing but I cannot express any more from the drain site.  The drain site is not red, it is mildly tenderness there is no induration or fluctuance.  His abdomen itself however is completely benign Back:  There is no focal tenderness or step off.  there is no midline tenderness there are no lesions noted. there is no CVA tenderness Musculoskeletal: No lower extremity tenderness, no upper extremity tenderness. No joint effusions, no DVT signs strong distal pulses no edema Neurologic:  Normal speech and language. No gross focal neurologic deficits are appreciated.  Skin:  Skin is warm, dry and intact. No rash noted. Psychiatric: Mood and affect are normal. Speech and behavior are normal.  ____________________________________________   LABS (all labs ordered are listed, but only abnormal results are displayed)  Labs Reviewed  COMPREHENSIVE METABOLIC PANEL - Abnormal; Notable for the following components:      Result Value   Glucose, Bld 106 (*)    Calcium 8.7 (*)    All other components within normal limits  AEROBIC CULTURE (SUPERFICIAL SPECIMEN)  LIPASE, BLOOD  CBC  URINALYSIS, COMPLETE (UACMP) WITH MICROSCOPIC    Pertinent labs  results that were available during my care of the patient were reviewed by me and considered in my medical decision making (see chart for details). ____________________________________________  EKG  I personally interpreted any EKGs ordered by me or triage  ____________________________________________  RADIOLOGY  Pertinent labs & imaging results that were available during my care of the patient were reviewed by me and considered in my medical decision making (see chart for details). If possible, patient and/or family made aware of  any abnormal findings.  No results found. ____________________________________________    PROCEDURES  Procedure(s) performed: None  Procedures  Critical Care performed: None  ____________________________________________   INITIAL IMPRESSION / ASSESSMENT AND PLAN / ED COURSE  Pertinent labs & imaging results that were available during my care of the patient were reviewed by me and considered in my medical decision making (see chart for details).  Had the pleasure discussed this patient with on-call surgeon, Dr. Lady Gary, and I very much appreciate the consult.  She and I agree that this time there is no indication for acute imaging, his belly is benign and his bowels are working fine, there is no evidence of sepsis or significant infection, however, there is a little bit of tenderness and some change to the patient's opinion of the consistency of the drainage to the area around the fistula site.  Is not passing stool certainly and it does not appear to be purulent it certainly could just be a fibrinous exudate, in any event, patient  is concerned about this we will give him Keflex for a few days after consultation with a surgeon and we will have him follow with a surgeon as outpatient precautions follow-up given understood.   ____________________________________________   FINAL CLINICAL IMPRESSION(S) / ED DIAGNOSES  Final diagnoses:  None      This chart was dictated using voice recognition software.  Despite best efforts to proofread,  errors can occur which can change meaning.      Jeanmarie PlantMcShane, Shyanna Klingel A, MD 03/05/18 [phone]

## 2018-03-05 NOTE — ED Notes (Signed)
Insertion site dressed with sterile gauze and tegaderm. Patient tolerated procedure well.

## 2018-03-05 NOTE — ED Triage Notes (Signed)
5 weeks ago states he had diverticulitis and had drain inserted to drain an abscess. States site is swollen and leaking puss. Symptoms began a few day ago. Talked to surgeon last week. No bleeding noted to bandage covering area. A&O, ambulatory. No distress noted.

## 2018-03-05 NOTE — Discharge Instructions (Signed)
It is possible that your drainage is caused by an infection.  However we do not have definitive proof of this.  If you have high fever increased pain, vomiting, fever, difficulty moving her bowels, redness, swelling or you feel worse in any way return to the emergency room.  Take the antibiotics until they are gone and follow-up with your surgeon on Monday.

## 2018-03-07 ENCOUNTER — Other Ambulatory Visit (HOSPITAL_COMMUNITY): Payer: Self-pay | Admitting: Surgery

## 2018-03-07 ENCOUNTER — Other Ambulatory Visit: Payer: Self-pay | Admitting: Surgery

## 2018-03-07 DIAGNOSIS — K5792 Diverticulitis of intestine, part unspecified, without perforation or abscess without bleeding: Secondary | ICD-10-CM

## 2018-03-07 DIAGNOSIS — L0291 Cutaneous abscess, unspecified: Secondary | ICD-10-CM

## 2018-03-07 LAB — AEROBIC CULTURE W GRAM STAIN (SUPERFICIAL SPECIMEN): Special Requests: NORMAL

## 2018-03-11 ENCOUNTER — Ambulatory Visit (HOSPITAL_COMMUNITY)
Admission: RE | Admit: 2018-03-11 | Discharge: 2018-03-11 | Disposition: A | Discharge status: Home / Self Care | Payer: Self-pay | Source: Ambulatory Visit | Attending: Surgery | Admitting: Surgery

## 2018-03-11 ENCOUNTER — Encounter (HOSPITAL_COMMUNITY): Payer: Self-pay | Admitting: Diagnostic Radiology

## 2018-03-11 DIAGNOSIS — K632 Fistula of intestine: Secondary | ICD-10-CM | POA: Insufficient documentation

## 2018-03-11 DIAGNOSIS — Z4803 Encounter for change or removal of drains: Secondary | ICD-10-CM | POA: Insufficient documentation

## 2018-03-11 DIAGNOSIS — K5792 Diverticulitis of intestine, part unspecified, without perforation or abscess without bleeding: Secondary | ICD-10-CM

## 2018-03-11 DIAGNOSIS — L0291 Cutaneous abscess, unspecified: Secondary | ICD-10-CM

## 2018-03-11 HISTORY — PX: IR SINUS/FIST TUBE CHK-NON GI: IMG673

## 2018-03-11 MED ORDER — IOPAMIDOL (ISOVUE-300) INJECTION 61%
INTRAVENOUS | Status: AC
  Administered 2018-03-11: 10 mL
  Filled 2018-03-11: qty 50

## 2018-03-11 NOTE — Progress Notes (Signed)
Pre-procedure dx: abdominal abscess Post-procedure dx: abdominal abscess  Successful removal of midline abdominal drain today in IR following drain injection at the request of Dr. Tonna BoehringerSakai. EBL: zero  Villa HerbShannon A Luciann Gossett, PA-C 03/11/2018 1206

## 2018-07-19 ENCOUNTER — Other Ambulatory Visit: Payer: Self-pay

## 2018-07-19 ENCOUNTER — Emergency Department: Payer: BLUE CROSS/BLUE SHIELD

## 2018-07-19 ENCOUNTER — Emergency Department
Admission: EM | Admit: 2018-07-19 | Discharge: 2018-07-19 | Disposition: A | Discharge status: Home / Self Care | Payer: BLUE CROSS/BLUE SHIELD | Attending: Emergency Medicine | Admitting: Emergency Medicine

## 2018-07-19 DIAGNOSIS — F17210 Nicotine dependence, cigarettes, uncomplicated: Secondary | ICD-10-CM | POA: Insufficient documentation

## 2018-07-19 DIAGNOSIS — L02211 Cutaneous abscess of abdominal wall: Secondary | ICD-10-CM | POA: Diagnosis not present

## 2018-07-19 DIAGNOSIS — Z79899 Other long term (current) drug therapy: Secondary | ICD-10-CM | POA: Insufficient documentation

## 2018-07-19 DIAGNOSIS — I10 Essential (primary) hypertension: Secondary | ICD-10-CM | POA: Diagnosis not present

## 2018-07-19 DIAGNOSIS — L089 Local infection of the skin and subcutaneous tissue, unspecified: Secondary | ICD-10-CM | POA: Diagnosis present

## 2018-07-19 DIAGNOSIS — L0291 Cutaneous abscess, unspecified: Secondary | ICD-10-CM

## 2018-07-19 HISTORY — DX: Essential (primary) hypertension: I10

## 2018-07-19 HISTORY — DX: Diverticulitis of intestine, part unspecified, without perforation or abscess without bleeding: K57.92

## 2018-07-19 LAB — COMPREHENSIVE METABOLIC PANEL
ALT: 12 U/L (ref 0–44)
AST: 18 U/L (ref 15–41)
Albumin: 4.1 g/dL (ref 3.5–5.0)
Alkaline Phosphatase: 49 U/L (ref 38–126)
Anion gap: 11 (ref 5–15)
BUN: 10 mg/dL (ref 6–20)
CO2: 25 mmol/L (ref 22–32)
Calcium: 9 mg/dL (ref 8.9–10.3)
Chloride: 103 mmol/L (ref 98–111)
Creatinine, Ser: 0.93 mg/dL (ref 0.61–1.24)
GFR calc Af Amer: >60 mL/min (ref >60)
GFR calc non Af Amer: >60 mL/min (ref >60)
Glucose, Bld: 107 mg/dL — ABNORMAL HIGH (ref 70–99)
Potassium: 3.8 mmol/L (ref 3.5–5.1)
Sodium: 139 mmol/L (ref 135–145)
Total Bilirubin: 0.8 mg/dL (ref 0.3–1.2)
Total Protein: 8.2 g/dL — ABNORMAL HIGH (ref 6.5–8.1)

## 2018-07-19 LAB — CBC
HCT: 43.9 % (ref 39.0–52.0)
Hemoglobin: 14.9 g/dL (ref 13.0–17.0)
MCH: 29.3 pg (ref 26.0–34.0)
MCHC: 33.9 g/dL (ref 30.0–36.0)
MCV: 86.4 fL (ref 80.0–100.0)
Platelets: 264 10*3/uL (ref 150–400)
RBC: 5.08 MIL/uL (ref 4.22–5.81)
RDW: 12.9 % (ref 11.5–15.5)
WBC: 6.3 10*3/uL (ref 4.0–10.5)
nRBC: 0 % (ref 0.0–0.2)

## 2018-07-19 LAB — LIPASE, BLOOD: Lipase: 32 U/L (ref 11–51)

## 2018-07-19 MED ORDER — SULFAMETHOXAZOLE-TRIMETHOPRIM 800-160 MG PO TABS: 1.0000 | ORAL_TABLET | Freq: Two times a day (BID) | ORAL | 0 refills | Status: DC

## 2018-07-19 MED ORDER — SULFAMETHOXAZOLE-TRIMETHOPRIM 800-160 MG PO TABS: 1.0000 | ORAL_TABLET | Freq: Two times a day (BID) | ORAL | 0 refills | Status: AC

## 2018-07-19 MED ORDER — LIDOCAINE HCL (PF) 1 % IJ SOLN
5.0000 mL | Freq: Once | INTRAMUSCULAR | Status: AC
  Administered 2018-07-19: 5 mL
  Filled 2018-07-19: qty 5

## 2018-07-19 MED ORDER — SODIUM CHLORIDE 0.9% FLUSH: 3.0000 mL | Freq: Once | INTRAVENOUS | Status: DC

## 2018-07-19 NOTE — ED Notes (Signed)
Pt c/o bloody drainage from wound x1 week. Denies purulent drainage; denies fever, chills, n/v/d. Approx 2.5cm area of induration to wound; bloody drainage visible. No warmth or odor.

## 2018-07-19 NOTE — ED Triage Notes (Signed)
Pt states he had ruptured diverticulitis in December with drain placement, states drain was removed the end of December . About 2 weeks ago he started having puss like drainage from the drain site, state now it is just blood . Pt has a bandage in place on arrival. Denies N/v/d/fever.states the area is tender to touch.Marland Kitchen

## 2018-07-19 NOTE — ED Notes (Signed)
Pt verbalized understanding of d/c instructions, RX, and f/u care. No further questions at this time. Pt ambulatory to the exit with steady gait  

## 2018-07-19 NOTE — ED Notes (Signed)
U/s tech at bedside 

## 2018-07-19 NOTE — ED Provider Notes (Signed)
[facility] Emergency Department Provider Note   ____________________________________________   I have reviewed the triage vital signs and the nursing notes.   HISTORY  Chief Complaint Wound Infection   History limited by: Not Limited   HPI DAID TRESCH is a 23 y.o. male who presents to the emergency department today because of concern for pus and blood draining from a site of previous drain tube. The patient was seen in December for diverticulitis and had drainage tube placed. It was removed in late December. The patient states that about a week ago a small pimple formed over that part. He then started noticed both pus and blood draining from that area. The patient states that a family friend prescribed him clindamycin which he has been taking without any real change. He denies any fevers. No nausea or vomiting.   Records reviewed. Per medical record review patient has a history of diverticulitis  Past Medical History:  Diagnosis Date  . Diverticulitis   . Hypertension     Patient Active Problem List   Diagnosis Date Noted  . Wound cellulitis 03/05/2018  . Diverticulitis large intestine 01/25/2018    Past Surgical History:  Procedure Laterality Date  . COLON SURGERY    . IR RADIOLOGIST EVAL & MGMT  02/08/2018  . IR RADIOLOGIST EVAL & MGMT  02/22/2018  . IR SINUS/FIST TUBE CHK-NON GI  03/11/2018    Prior to Admission medications   Medication Sig Start Date End Date Taking? Authorizing Provider  ibuprofen (ADVIL,MOTRIN) 800 MG tablet Take 1 tablet (800 mg total) by mouth every 8 (eight) hours as needed for mild pain or moderate pain. 01/28/18   Sakai, Isami, DO  ondansetron (ZOFRAN ODT) 8 MG disintegrating tablet Take 1 tablet (8 mg total) by mouth every 8 (eight) hours as needed for nausea or vomiting. 01/25/18   Dionne Bucy, MD  triamcinolone cream (KENALOG) 0.1 % Apply 1 application topically 2 (two) times daily. Patient not taking:  Reported on 01/25/2018 04/10/16   Linna Hoff, MD    Allergies Patient has no known allergies.  No family history on file.  Social History Social History   Tobacco Use  . Smoking status: Current Every Day Smoker  . Smokeless tobacco: Never Used  Substance Use Topics  . Alcohol use: Yes    Comment: occ  . Drug use: No    Review of Systems Constitutional: No fever/chills Eyes: No visual changes. ENT: No sore throat. Cardiovascular: Denies chest pain. Respiratory: Denies shortness of breath. Gastrointestinal: No abdominal pain.  No nausea, no vomiting.  No diarrhea.   Genitourinary: Negative for dysuria. Musculoskeletal: Negative for back pain. Skin: Drainage from site of previous drain tube.  Neurological: Negative for headaches, focal weakness or numbness.  ____________________________________________   PHYSICAL EXAM:  VITAL SIGNS: ED Triage Vitals [07/19/18 1816]  Enc Vitals Group     BP 131/74     Pulse Rate 87     Resp 17     Temp 98.6 F (37 C)     Temp Source Oral     SpO2 96 %     Weight 260 lb (117.9 kg)     Height 6' (1.829 m)     Head Circumference      Peak Flow      Pain Score 0   Constitutional: Alert and oriented.  Eyes: Conjunctivae are normal.  ENT      Head: Normocephalic and atraumatic.      Nose: No  congestion/rhinnorhea.      Mouth/Throat: Mucous membranes are moist.      Neck: No stridor. Hematological/Lymphatic/Immunilogical: No cervical lymphadenopathy. Cardiovascular: Normal rate, regular rhythm.  No murmurs, rubs, or gallops.  Respiratory: Normal respiratory effort without tachypnea nor retractions. Breath sounds are clear and equal bilaterally. No wheezes/rales/rhonchi. Gastrointestinal: Soft and non tender. No rebound. No guarding.  Genitourinary: Deferred Musculoskeletal: Normal range of motion in all extremities. No lower extremity edema. Neurologic:  Normal speech and language. No gross focal neurologic deficits are  appreciated.  Skin:  Small area of drainage in the abdomen.  Psychiatric: Mood and affect are normal. Speech and behavior are normal. Patient exhibits appropriate insight and judgment.  ____________________________________________    LABS (pertinent positives/negatives)  Lipase 32 CBC wbc 6.3, hgb 14.9, plt 264 CMP wnl except glu 107, t pro 8.2  ____________________________________________   EKG  None  ____________________________________________    RADIOLOGY  US Subcutaneous abscess  ____________________________________________   PROCEDURES  Procedures  Incision and Drainage of Abcess Location: abdominal wall Anesthesia Local: 1% Lidocaine  Prep/Procedure: Skin Prep: Chlorahex Incised abscess with #11 blade Purulent discharge: small Probed to break up loculations Packed with 1/4" gauze Estimated blood loss: 1  ml  ____________________________________________   INITIAL IMPRESSION / ASSESSMENT AND PLAN / ED COURSE  Pertinent labs & imaging results that were available during my care of the patient were reviewed by me and considered in my medical decision making (see chart for details).   Patient presented to the emergency department today because of concerns for infection at site of previous drainage tube.  On exam he could express a small amount of pus and blood from that site.  Ultrasound showed a fluid collection underneath.  Did open that up and performed an I&D with small to moderate amount of pus.  Will switch patient to Bactrim.  Discussed with patient importance of following up with surgery clinic. ____________________________________________   FINAL CLINICAL IMPRESSION(S) / ED DIAGNOSES  Final diagnoses:  Abscess     Note: This dictation was prepared with Dragon dictation. Any transcriptional errors that result from this process are unintentional     Phineas SemenGoodman, Mallory Schaad, MD 07/19/18 2131

## 2018-07-19 NOTE — Discharge Instructions (Addendum)
Please seek medical attention for any high fevers, chest pain, shortness of breath, change in behavior, persistent vomiting, bloody stool or any other new or concerning symptoms.  

## 2020-01-29 ENCOUNTER — Ambulatory Visit (INDEPENDENT_AMBULATORY_CARE_PROVIDER_SITE_OTHER): Payer: BLUE CROSS/BLUE SHIELD | Admitting: Family Medicine

## 2020-01-29 ENCOUNTER — Other Ambulatory Visit: Payer: Self-pay

## 2020-01-29 ENCOUNTER — Ambulatory Visit: Payer: Self-pay | Admitting: Family Medicine

## 2020-01-29 ENCOUNTER — Encounter: Payer: Self-pay | Admitting: Family Medicine

## 2020-01-29 VITALS — BP 131/84 | HR 77 | Temp 98.0°F | Ht 72.0 in | Wt 302.0 lb

## 2020-01-29 DIAGNOSIS — Z7689 Persons encountering health services in other specified circumstances: Secondary | ICD-10-CM | POA: Insufficient documentation

## 2020-01-29 DIAGNOSIS — L309 Dermatitis, unspecified: Secondary | ICD-10-CM | POA: Diagnosis not present

## 2020-01-29 MED ORDER — BETAMETHASONE DIPROPIONATE 0.05 % EX LOTN: TOPICAL_LOTION | Freq: Two times a day (BID) | CUTANEOUS | 0 refills | Status: AC

## 2020-01-29 NOTE — Assessment & Plan Note (Signed)
New patient establishment at [facility] for primary care.   

## 2020-01-29 NOTE — Patient Instructions (Signed)
I have sent in a prescription for betamethasone topical lotion to apply to the skin 2x per day for your eczema.  Be sure to pat dry after you shower and not rub the skin with any towels to avoid worsening of your eczema.  We will plan to see you back in 4 weeks for eczema follow up visit  You will receive a survey after today's visit either digitally by e-mail or paper by USPS mail. Your experiences and feedback matter to [place].  Please respond so we know how we are doing as we provide care for you.  Call us with any questions/concerns/needs.  It is my goal to be available to you for your health concerns.  Thanks for choosing me to be a partner in your healthcare needs!  Charlaine Dalton, FNP-C Family Nurse Practitioner [facility] Phone: [phone]

## 2020-01-29 NOTE — Progress Notes (Signed)
Subjective:    Patient ID: Steven Wall, male    DOB: [birth year], 24 y.o.   MRN: [id number]  Steven Wall is a 24 y.o. male presenting on 01/29/2020 for Establish Care and Eczema (wants to know if there are better cream for skin )   HPI  Mr. Wengert presents to clinic as a new patient to establish for primary care.  Previous PCP was > 10 years ago, unknown location/provider.  Records will not be requested.  Past medical, family, and surgical history reviewed w/ pt.  He has acute concerns today for eczema.  Reports he has been using triamcinolone cream for many years without improvement in his skin.  States he had been to an allergist when he was a child and used to get shots monthly, but is unsure what he is allergic to or who he used to follow with.  Has no other acute concerns today.  Depression screen [facility] 2/9 01/29/2020  Decreased Interest 0  Down, Depressed, Hopeless 0  PHQ - 2 Score 0  Altered sleeping 0  Tired, decreased energy 0  Change in appetite 0  Feeling bad or failure about yourself  0  Trouble concentrating 0  Moving slowly or fidgety/restless 0  Suicidal thoughts 0  PHQ-9 Score 0    Social History   Tobacco Use  . Smoking status: Current Every Day Smoker  . Smokeless tobacco: Never Used  Vaping Use  . Vaping Use: Never used  Substance Use Topics  . Alcohol use: Yes    Comment: occ  . Drug use: No    Review of Systems  Constitutional: Negative.   HENT: Negative.   Eyes: Negative.   Respiratory: Negative.   Cardiovascular: Negative.   Gastrointestinal: Negative.   Endocrine: Negative.   Genitourinary: Negative.   Musculoskeletal: Negative.   Skin: Positive for rash. Negative for wound.  Allergic/Immunologic: Negative.   Neurological: Negative.   Hematological: Negative.   Psychiatric/Behavioral: Negative.    Per HPI unless specifically indicated above     Objective:    BP 131/84   Pulse 77   Temp 98 F (36.7 C) (Oral)   Ht 6' (1.829  m)   Wt (!) 302 lb (137 kg)   SpO2 100%   BMI 40.96 kg/m   Wt Readings from Last 3 Encounters:  01/29/20 (!) 302 lb (137 kg)  07/19/18 260 lb (117.9 kg)  03/05/18 260 lb (117.9 kg)    Physical Exam Vitals and nursing note reviewed.  Constitutional:      General: He is not in acute distress.    Appearance: Normal appearance. He is well-developed and well-groomed. He is morbidly obese. He is not ill-appearing or toxic-appearing.  HENT:     Head: Normocephalic and atraumatic.     Nose:     Comments: Lesia Sago is in place, covering mouth and nose. Eyes:     General:        Right eye: No discharge.        Left eye: No discharge.     Extraocular Movements: Extraocular movements intact.     Conjunctiva/sclera: Conjunctivae normal.     Pupils: Pupils are equal, round, and reactive to light.  Cardiovascular:     Rate and Rhythm: Normal rate and regular rhythm.     Pulses: Normal pulses.     Heart sounds: Normal heart sounds. No murmur heard.  No friction rub. No gallop.   Pulmonary:     Effort: Pulmonary effort is normal.  No respiratory distress.     Breath sounds: Normal breath sounds.  Skin:    General: Skin is warm and dry.     Capillary Refill: Capillary refill takes less than 2 seconds.     Findings: Rash present.     Comments: Multiple areas of eczema noted on bilateral upper extremities, no s/s of infection/excoriation  Neurological:     General: No focal deficit present.     Mental Status: He is alert and oriented to person, place, and time.  Psychiatric:        Attention and Perception: Attention and perception normal.        Mood and Affect: Mood and affect normal.        Speech: Speech normal.        Behavior: Behavior normal. Behavior is cooperative.        Thought Content: Thought content normal.        Cognition and Memory: Cognition and memory normal.    Results for orders placed or performed during the hospital encounter of 07/19/18  Lipase, blood  Result  Value Ref Range   Lipase 32 11 - 51 U/L  Comprehensive metabolic panel  Result Value Ref Range   Sodium 139 135 - 145 mmol/L   Potassium 3.8 3.5 - 5.1 mmol/L   Chloride 103 98 - 111 mmol/L   CO2 25 22 - 32 mmol/L   Glucose, Bld 107 (H) 70 - 99 mg/dL   BUN 10 6 - 20 mg/dL   Creatinine, Ser 5.63 0.61 - 1.24 mg/dL   Calcium 9.0 8.9 - 87.5 mg/dL   Total Protein 8.2 (H) 6.5 - 8.1 g/dL   Albumin 4.1 3.5 - 5.0 g/dL   AST 18 15 - 41 U/L   ALT 12 0 - 44 U/L   Alkaline Phosphatase 49 38 - 126 U/L   Total Bilirubin 0.8 0.3 - 1.2 mg/dL   GFR calc non Af Amer >60 >60 mL/min   GFR calc Af Amer >60 >60 mL/min   Anion gap 11 5 - 15  CBC  Result Value Ref Range   WBC 6.3 4.0 - 10.5 K/uL   RBC 5.08 4.22 - 5.81 MIL/uL   Hemoglobin 14.9 13.0 - 17.0 g/dL   HCT 64.3 39 - 52 %   MCV 86.4 80.0 - 100.0 fL   MCH 29.3 26.0 - 34.0 pg   MCHC 33.9 30.0 - 36.0 g/dL   RDW 32.9 51.8 - 84.1 %   Platelets 264 150 - 400 K/uL   nRBC 0.0 0.0 - 0.2 %      Assessment & Plan:   Problem List Items Addressed This Visit      Musculoskeletal and Integument   Eczema    Has tried triamcinolone cream in the past with minimal improvement.  Will start of betamethasone topical cream 2x daily.  Advised not to rub skin with towel after bathing, to pat dry, and to apply the lotion to his areas of eczema when his skin is damp to help the skin absorb the lotion.  To RTC in 4 weeks for re-evaluation.      Relevant Medications   betamethasone dipropionate 0.05 % lotion     Other   Encounter to establish care with new doctor - Primary    New patient establishment at [facility] for primary care.           Meds ordered this encounter  Medications  . betamethasone dipropionate 0.05 % lotion  Sig: Apply topically 2 (two) times daily.    Dispense:  60 mL    Refill:  0    Follow up plan: Return in about 4 weeks (around 02/26/2020) for Eczema follow up visit.   Charlaine Dalton, FNP Family Nurse Practitioner [facility] 01/29/2020, 2:45 PM

## 2020-01-29 NOTE — Assessment & Plan Note (Signed)
Has tried triamcinolone cream in the past with minimal improvement.  Will start of betamethasone topical cream 2x daily.  Advised not to rub skin with towel after bathing, to pat dry, and to apply the lotion to his areas of eczema when his skin is damp to help the skin absorb the lotion.  To RTC in 4 weeks for re-evaluation.

## 2020-10-17 IMAGING — CT CT ABD-PELV W/ CM
2 of 4 series · 16 of 46 positions shown, 18 images · IV contrast (APPLIED)
Comparison: None.

CLINICAL DATA: Lower abdominal pain for 1 week.  Fever and chills.

EXAM:
CT ABDOMEN AND PELVIS WITH CONTRAST
TECHNIQUE: Multidetector CT imaging of the abdomen and pelvis was performed
using the standard protocol following bolus administration of
intravenous contrast.
CONTRAST:  100mL 9GPEH2-5MM IOPAMIDOL (9GPEH2-5MM) INJECTION 61%

[Series 2: routine abd/pel with · axial · 0.77mm/px · z∈[-585,-120]mm · 13 of 103 slices shown, 15 images]
[im 5/103  soft-tissue]
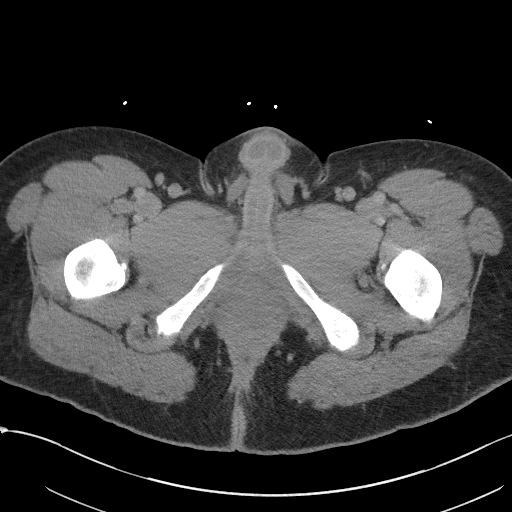
[im 5/103  bone]
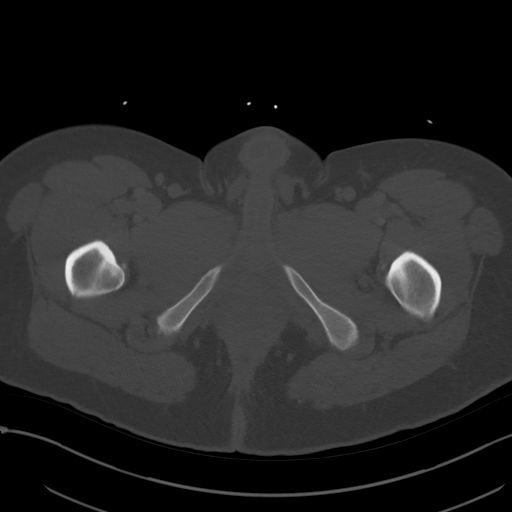
[im 14/103  soft-tissue]
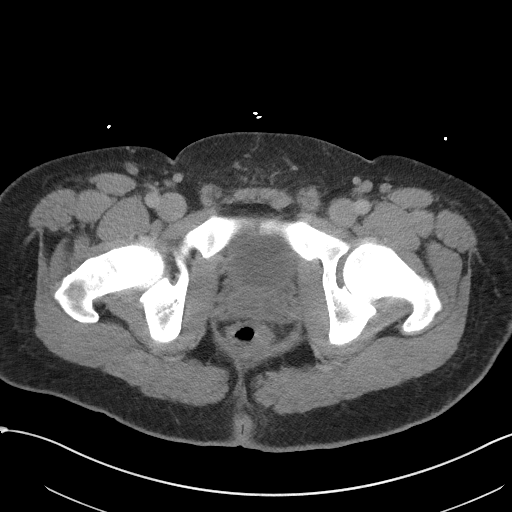
[im 23/103  soft-tissue]
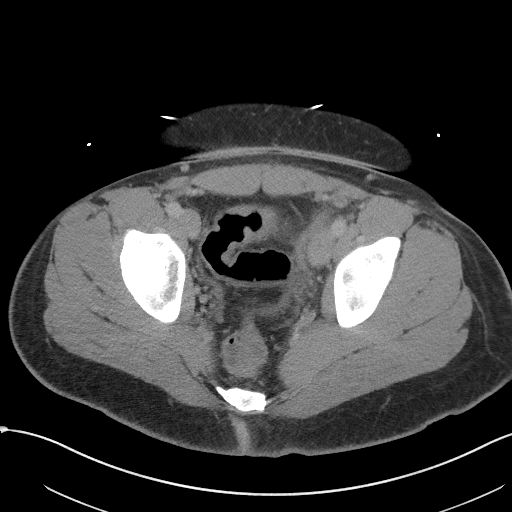
[im 27/103  soft-tissue]
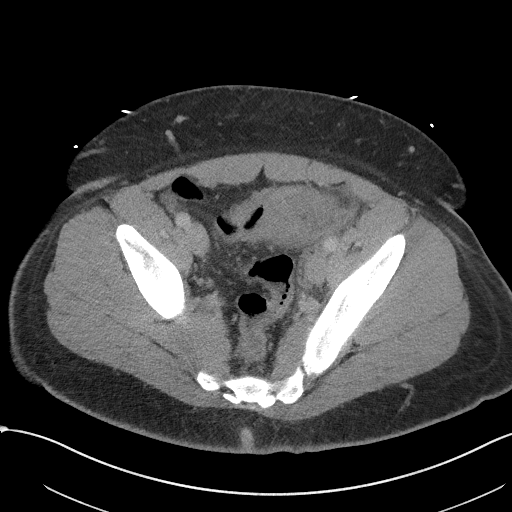
[im 36/103  soft-tissue]
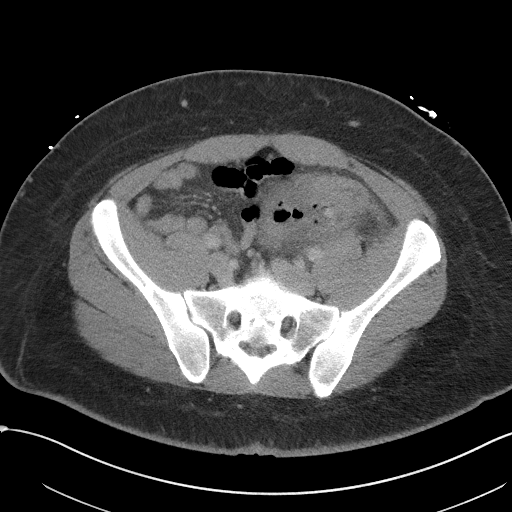
[im 45/103  soft-tissue]
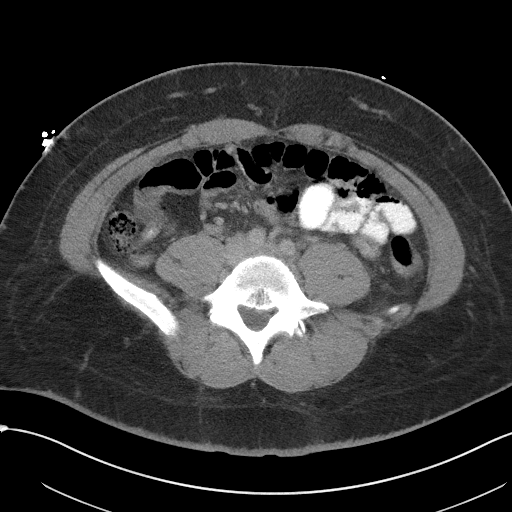
[im 54/103  soft-tissue]
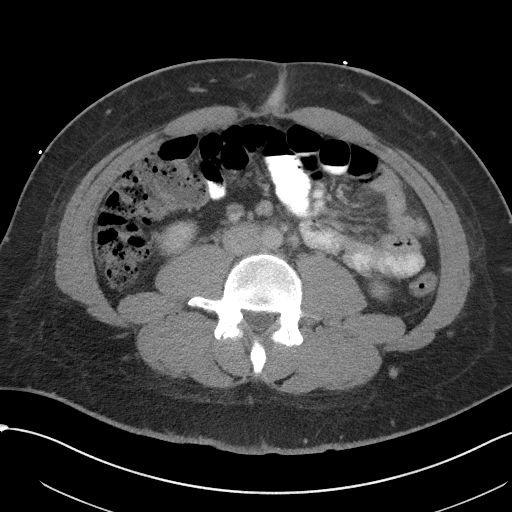
[im 58/103  soft-tissue]
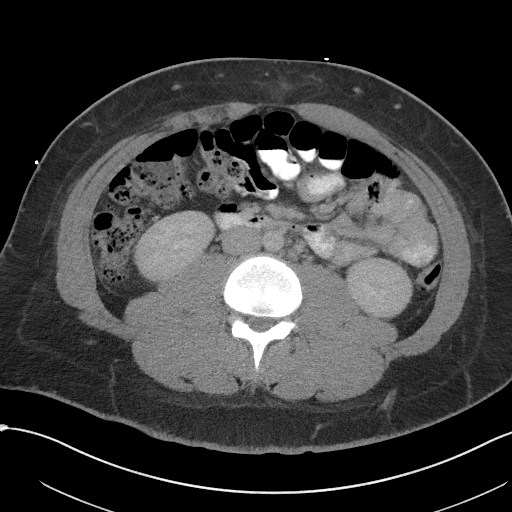
[im 67/103  soft-tissue]
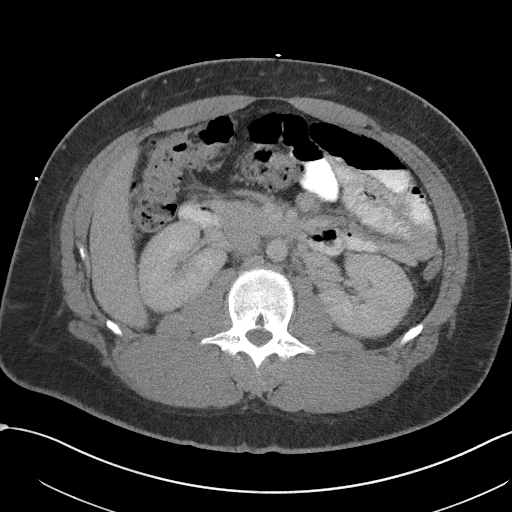
[im 67/103  bone]
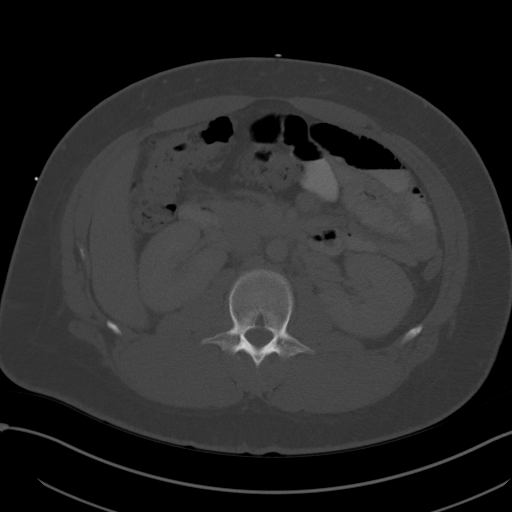
[im 76/103  soft-tissue]
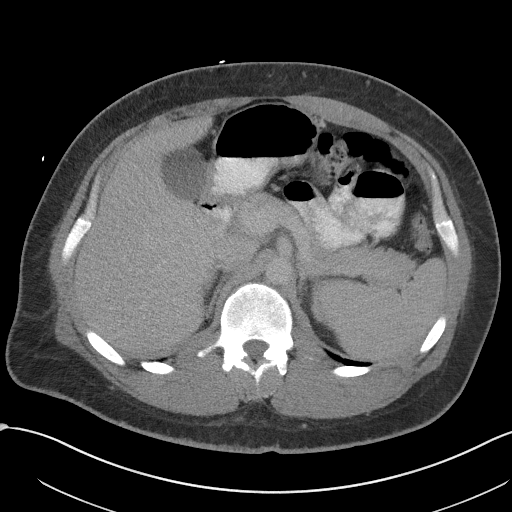
[im 80/103  soft-tissue]
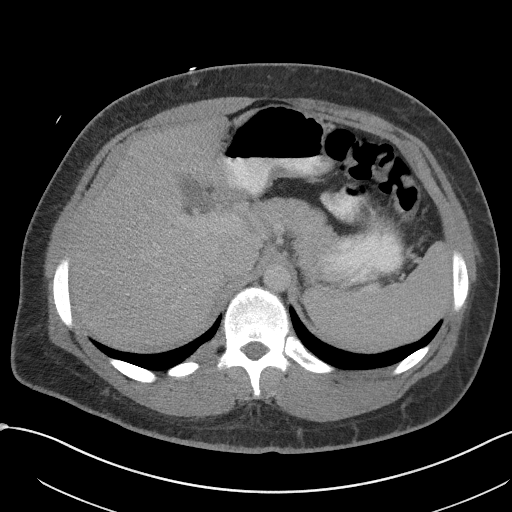
[im 89/103  soft-tissue]
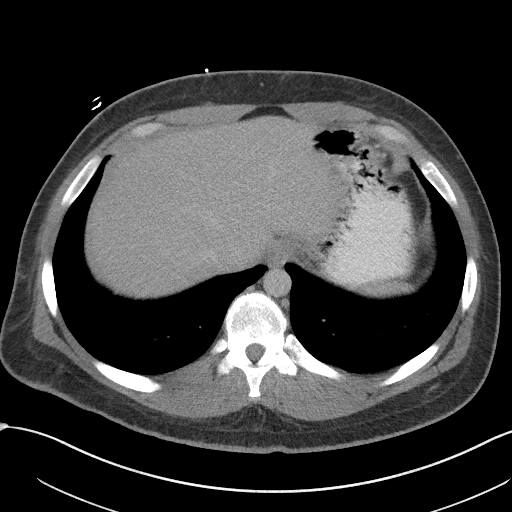
[im 98/103  soft-tissue]
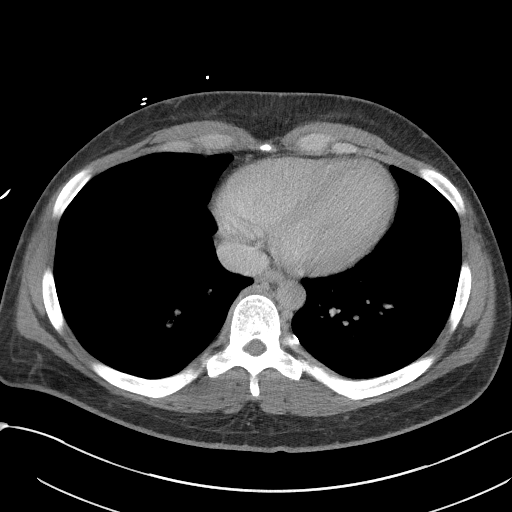

[Series 5: coronal st · coronal · 0.88mm/px · 3 of 89 slices shown]
[im 30/89  soft-tissue]
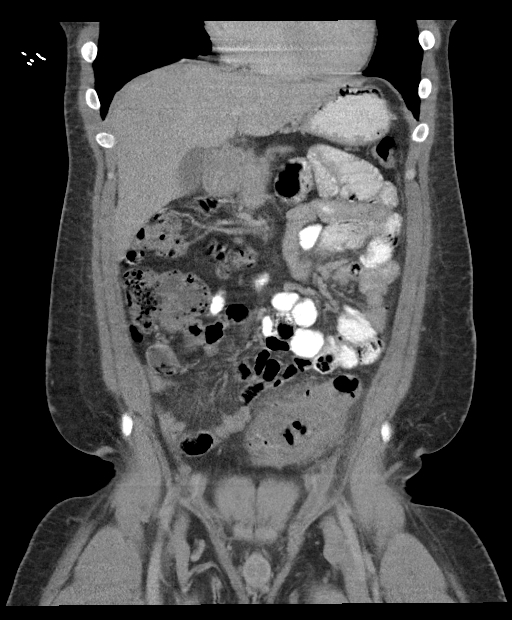
[im 40/89  soft-tissue]
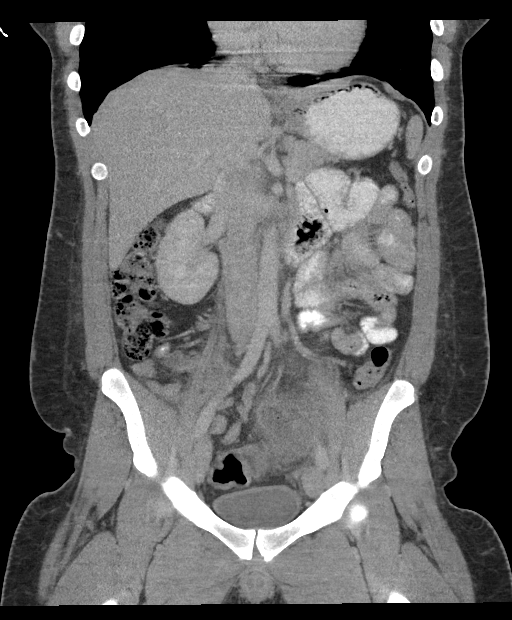
[im 49/89  soft-tissue]
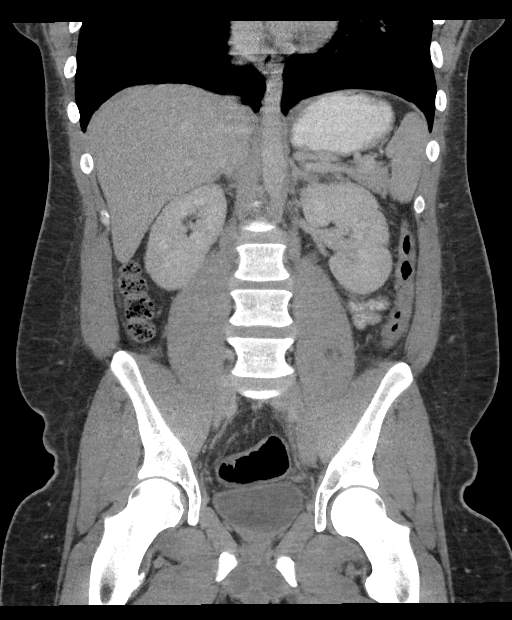

[16 of 46 positions shown; findings below may reference images not displayed]

FINDINGS: Lower chest: The lung bases are clear of acute process. No pleural
effusion or pulmonary lesions. The heart is normal in size. No
pericardial effusion. The distal esophagus and aorta are
unremarkable.

Hepatobiliary: No focal hepatic lesions or intrahepatic biliary
dilatation. The gallbladder is normal. No common bile duct
dilatation.

Pancreas: No mass, inflammation or ductal dilatation.

Spleen: Normal size.  No focal lesions.

Adrenals/Urinary Tract: The adrenal glands and kidneys are
unremarkable. The bladder is normal.

Stomach/Bowel: The stomach, duodenum and small bowel are
unremarkable. No acute inflammatory changes, mass lesions or
obstructive findings.

Diverticulosis of the sigmoid colon is noted with acute
diverticulitis and a large diverticular abscess measuring 5.3 x
cm. The remainder of the colon is unremarkable except for scattered
diverticuli. The appendix is normal.

Vascular/Lymphatic: The aorta is normal in caliber. No dissection.
The branch vessels are patent. The major venous structures are
patent. No mesenteric or retroperitoneal mass or adenopathy. Small
scattered lymph nodes are noted.

Reproductive: The prostate gland and seminal vesicles are
unremarkable.

Other: No inguinal mass or adenopathy.  No inguinal hernia.

Musculoskeletal: No significant findings.
IMPRESSION: 1. Acute diverticulitis involving the sigmoid colon with associated
5 cm diverticular abscess.
2. No other acute abdominal findings and no mass lesions or
adenopathy.

## 2023-03-04 DIAGNOSIS — L853 Xerosis cutis: Secondary | ICD-10-CM | POA: Diagnosis not present

## 2023-03-04 DIAGNOSIS — L309 Dermatitis, unspecified: Secondary | ICD-10-CM | POA: Diagnosis not present

## 2023-04-02 ENCOUNTER — Other Ambulatory Visit: Payer: Self-pay

## 2023-04-02 ENCOUNTER — Other Ambulatory Visit (HOSPITAL_COMMUNITY): Payer: Self-pay

## 2023-04-02 ENCOUNTER — Telehealth: Payer: Self-pay | Admitting: Pharmacist

## 2023-04-02 ENCOUNTER — Other Ambulatory Visit: Payer: Self-pay | Admitting: Pharmacy Technician

## 2023-04-02 MED ORDER — DUPIXENT 300 MG/2ML ~~LOC~~ SOAJ: SUBCUTANEOUS | 11 refills | Status: DC

## 2023-04-02 MED ORDER — DUPIXENT 300 MG/2ML ~~LOC~~ SOAJ: SUBCUTANEOUS | 0 refills | Status: DC

## 2023-04-02 NOTE — Telephone Encounter (Signed)
Called patient to schedule an appointment for the [employer] [facility]. I was unable to reach the patient so I left a HIPAA-compliant message requesting that the patient return my call.   Luke Van Ausdall, PharmD, BCACP, CPP Clinical Pharmacist [facility] [phone]  

## 2023-04-05 ENCOUNTER — Other Ambulatory Visit: Payer: Self-pay

## 2023-04-06 ENCOUNTER — Other Ambulatory Visit: Payer: Self-pay

## 2023-04-06 NOTE — Progress Notes (Signed)
Pharmacy Patient Advocate Encounter  Insurance verification completed.   The patient is insured through [facility]   Ran test claim for Dupixent. Currently a quantity of 4 mls is a 14 day supply (loading dose) and 4 mls is a 28 day supply (maintenance dose). PA required for both.   Office has already started PA. Status is pending.

## 2023-04-09 ENCOUNTER — Other Ambulatory Visit: Payer: Self-pay

## 2023-04-12 ENCOUNTER — Other Ambulatory Visit (HOSPITAL_COMMUNITY): Payer: Self-pay

## 2023-04-13 ENCOUNTER — Other Ambulatory Visit: Payer: Self-pay | Admitting: Pharmacist

## 2023-04-13 ENCOUNTER — Other Ambulatory Visit (HOSPITAL_COMMUNITY): Payer: Self-pay

## 2023-04-13 ENCOUNTER — Ambulatory Visit: Payer: Self-pay | Attending: Internal Medicine | Admitting: Pharmacist

## 2023-04-13 DIAGNOSIS — Z79899 Other long term (current) drug therapy: Secondary | ICD-10-CM

## 2023-04-13 NOTE — Progress Notes (Signed)
   S: Patient presents for review of their specialty medication therapy.  Patient is about to start taking Dupixent for atopic dermatitis. Patient is managed by Ashley Mariner for this.   Adherence: has not started   Efficacy: has not started   Dosing: 600 mg subcutaneously x1 followed by 300 mg subcutaneously once every 14 days  Dose adjustments: Renal: no dose adjustments (has not been studied) Hepatic: no dose adjustments (has not been studied)  Drug-drug interactions: none noted   Monitoring: S/sx of infection: none  S/sx of hypersensitivity: none  S/sx of ocular effects: none  S/sx of eosinophilia/vasculitis: none  O:     Lab Results  Component Value Date   WBC 6.3 07/19/2018   HGB 14.9 07/19/2018   HCT 43.9 07/19/2018   MCV 86.4 07/19/2018   PLT 264 07/19/2018      Chemistry      Component Value Date/Time   NA 139 07/19/2018 1819   K 3.8 07/19/2018 1819   CL 103 07/19/2018 1819   CO2 25 07/19/2018 1819   BUN 10 07/19/2018 1819   CREATININE 0.93 07/19/2018 1819      Component Value Date/Time   CALCIUM 9.0 07/19/2018 1819   ALKPHOS 49 07/19/2018 1819   AST 18 07/19/2018 1819   ALT 12 07/19/2018 1819   BILITOT 0.8 07/19/2018 1819       A/P: 1. Medication review: Patient currently on Dupixent for atopic dermatitis. Reviewed the medication with the patient, including the following: Dupixent is a monoclonal antibody used for the treatment of asthma or atopic dermatitis. Patient educated on purpose, proper use and potential adverse effects of Dupixent. Possible adverse effects include increased risk of infection, ocular effects, vasculitis/eosinophilia, and hypersensitivity reactions. Administer as a SubQ injection and rotate sites. Allow the medication to reach room temp prior to administration (45 mins for 300 mg syringe or 30 min for 200 mg syringe). Do not shake. Discard any unused portion. No recommendations for any changes.  Butch Penny, PharmD,  Patsy [place], CPP Clinical Pharmacist [facility] [phone]

## 2023-04-13 NOTE — Progress Notes (Signed)
Please see OV from 04/13/2023 for complete documentation.  Butch Penny, PharmD, Patsy Baltimore, CPP Clinical Pharmacist [facility] [phone]

## 2023-04-14 ENCOUNTER — Other Ambulatory Visit: Payer: Self-pay | Admitting: Pharmacist

## 2023-04-14 MED ORDER — DUPIXENT 300 MG/2ML ~~LOC~~ SOAJ
SUBCUTANEOUS | 0 refills | Status: AC
  Filled 2023-04-15: qty 4, fill #0
  Filled 2023-04-15: qty 4, 14d supply, fill #0

## 2023-04-14 MED ORDER — DUPIXENT 300 MG/2ML ~~LOC~~ SOAJ
SUBCUTANEOUS | 11 refills | Status: AC
  Filled 2023-04-15: qty 4, fill #0
  Filled 2023-04-15: qty 4, 28d supply, fill #0
  Filled 2023-05-21 (×2): qty 4, 28d supply, fill #1
  Filled 2023-06-22: qty 4, 28d supply, fill #2
  Filled 2023-07-20: qty 4, 28d supply, fill #3
  Filled 2023-08-23 – 2023-08-25 (×2): qty 4, 28d supply, fill #4
  Filled 2023-09-15: qty 4, 28d supply, fill #5
  Filled 2023-10-06 – 2023-10-20 (×4): qty 4, 28d supply, fill #6
  Filled 2023-11-16 – 2023-11-18 (×3): qty 4, 28d supply, fill #7
  Filled 2023-12-15: qty 4, 28d supply, fill #8
  Filled 2024-01-13 – 2024-01-17 (×2): qty 4, 28d supply, fill #9
  Filled 2024-02-16 – 2024-03-01 (×2): qty 4, 28d supply, fill #10
  Filled 2024-03-22: qty 4, 28d supply, fill #11

## 2023-04-15 ENCOUNTER — Other Ambulatory Visit: Payer: Self-pay

## 2023-04-15 ENCOUNTER — Other Ambulatory Visit (HOSPITAL_COMMUNITY): Payer: Self-pay

## 2023-04-15 NOTE — Progress Notes (Signed)
Specialty Pharmacy Initial Fill Coordination Note  Steven Wall is a 28 y.o. male contacted today regarding initial fill of specialty medication(s) Dupilumab (Dupixent) -maintenance dose  Patient requested Delivery   Delivery date: 2/11   Verified address: [address]   Medication will be filled on 2/10.   Patient is aware of $0 copayment.

## 2023-04-15 NOTE — Progress Notes (Signed)
Specialty Pharmacy Initial Fill Coordination Note  Steven Wall is a 28 y.o. male contacted today regarding initial fill of specialty medication(s) Dupilumab (Dupixent) -loading dose  Patient requested Delivery   Delivery date: 04/16/23   Verified address: [address]   Medication will be filled on 1/30.   Patient is aware of $0 copayment.

## 2023-04-15 NOTE — Progress Notes (Signed)
PA approved.

## 2023-04-26 ENCOUNTER — Other Ambulatory Visit: Payer: Self-pay

## 2023-04-27 ENCOUNTER — Other Ambulatory Visit: Payer: Self-pay

## 2023-04-29 ENCOUNTER — Other Ambulatory Visit: Payer: Self-pay

## 2023-05-14 ENCOUNTER — Other Ambulatory Visit: Payer: Self-pay

## 2023-05-18 ENCOUNTER — Other Ambulatory Visit (HOSPITAL_COMMUNITY): Payer: Self-pay

## 2023-05-21 ENCOUNTER — Other Ambulatory Visit: Payer: Self-pay

## 2023-05-21 ENCOUNTER — Other Ambulatory Visit (HOSPITAL_COMMUNITY): Payer: Self-pay

## 2023-05-21 NOTE — Progress Notes (Signed)
 Specialty Pharmacy Refill Coordination Note  Steven Wall is a 28 y.o. male contacted today regarding refills of specialty medication(s) No data recorded  Patient requested (Patient-Rptd) Delivery   Delivery date: (Patient-Rptd) 06/01/23   Verified address: (Patient-Rptd) [address]   Medication will be filled on 05/31/23.

## 2023-05-31 ENCOUNTER — Other Ambulatory Visit: Payer: Self-pay

## 2023-06-22 ENCOUNTER — Other Ambulatory Visit: Payer: Self-pay

## 2023-06-22 NOTE — Progress Notes (Signed)
 Specialty Pharmacy Refill Coordination Note  Steven Wall is a 28 y.o. male contacted today regarding refills of specialty medication(s) Dupilumab (Dupixent)   Patient requested (Patient-Rptd) Delivery   Delivery date: (Patient-Rptd) 06/30/23   Verified address: (Patient-Rptd) [address] [place] [address]   Medication will be filled on 04.15.25.

## 2023-06-29 ENCOUNTER — Other Ambulatory Visit: Payer: Self-pay

## 2023-07-07 ENCOUNTER — Other Ambulatory Visit (HOSPITAL_COMMUNITY): Payer: Self-pay

## 2023-07-07 DIAGNOSIS — L2089 Other atopic dermatitis: Secondary | ICD-10-CM | POA: Diagnosis not present

## 2023-07-07 MED ORDER — TRIAMCINOLONE ACETONIDE 0.1 % EX CREA
1.0000 | TOPICAL_CREAM | Freq: Two times a day (BID) | CUTANEOUS | 0 refills | Status: AC
  Filled 2023-07-07 – 2024-03-29 (×2): qty 454, 90d supply, fill #0

## 2023-07-20 ENCOUNTER — Other Ambulatory Visit (HOSPITAL_COMMUNITY): Payer: Self-pay

## 2023-07-20 ENCOUNTER — Other Ambulatory Visit: Payer: Self-pay

## 2023-07-20 ENCOUNTER — Other Ambulatory Visit: Payer: Self-pay | Admitting: Pharmacy Technician

## 2023-07-20 NOTE — Progress Notes (Signed)
 Specialty Pharmacy Refill Coordination Note  Steven Wall is a 28 y.o. male contacted today regarding refills of specialty medication(s) Dupilumab  (Dupixent )   Patient requested Delivery   Delivery date: 07/23/23 (We do not deliver coldchain on Fridays.)   Verified address: [address] [place]   Medication will be filled on 07/22/23.    Unable to LVM- call cannot be completed. We do not do Friday delivery. Can fill 5/8 for 5/9 delivery for 5/14 injection.

## 2023-07-21 ENCOUNTER — Other Ambulatory Visit (HOSPITAL_COMMUNITY): Payer: Self-pay

## 2023-07-26 ENCOUNTER — Other Ambulatory Visit: Payer: Self-pay

## 2023-07-26 ENCOUNTER — Emergency Department: Admission: EM | Admit: 2023-07-26 | Discharge: 2023-07-26 | Disposition: A | Discharge status: Home / Self Care

## 2023-07-26 DIAGNOSIS — K5790 Diverticulosis of intestine, part unspecified, without perforation or abscess without bleeding: Secondary | ICD-10-CM | POA: Diagnosis not present

## 2023-07-26 DIAGNOSIS — R1032 Left lower quadrant pain: Secondary | ICD-10-CM

## 2023-07-26 DIAGNOSIS — K5792 Diverticulitis of intestine, part unspecified, without perforation or abscess without bleeding: Secondary | ICD-10-CM

## 2023-07-26 DIAGNOSIS — K5732 Diverticulitis of large intestine without perforation or abscess without bleeding: Secondary | ICD-10-CM | POA: Diagnosis not present

## 2023-07-26 LAB — COMPREHENSIVE METABOLIC PANEL WITH GFR
ALT: 21 U/L (ref 0–44)
AST: 21 U/L (ref 15–41)
Albumin: 3.7 g/dL (ref 3.5–5.0)
Alkaline Phosphatase: 47 U/L (ref 38–126)
Anion gap: 7 (ref 5–15)
BUN: 13 mg/dL (ref 6–20)
CO2: 27 mmol/L (ref 22–32)
Calcium: 8.8 mg/dL — ABNORMAL LOW (ref 8.9–10.3)
Chloride: 103 mmol/L (ref 98–111)
Creatinine, Ser: 1.02 mg/dL (ref 0.61–1.24)
GFR, Estimated: >60 mL/min (ref >60)
Glucose, Bld: 92 mg/dL (ref 70–99)
Potassium: 3.9 mmol/L (ref 3.5–5.1)
Sodium: 137 mmol/L (ref 135–145)
Total Bilirubin: 0.6 mg/dL (ref 0.0–1.2)
Total Protein: 7.9 g/dL (ref 6.5–8.1)

## 2023-07-26 LAB — CBC
HCT: 45.9 % (ref 39.0–52.0)
Hemoglobin: 14.9 g/dL (ref 13.0–17.0)
MCH: 28.5 pg (ref 26.0–34.0)
MCHC: 32.5 g/dL (ref 30.0–36.0)
MCV: 87.9 fL (ref 80.0–100.0)
Platelets: 273 10*3/uL (ref 150–400)
RBC: 5.22 MIL/uL (ref 4.22–5.81)
RDW: 13.2 % (ref 11.5–15.5)
WBC: 10 10*3/uL (ref 4.0–10.5)
nRBC: 0 % (ref 0.0–0.2)

## 2023-07-26 LAB — LIPASE, BLOOD: Lipase: 32 U/L (ref 11–51)

## 2023-07-26 MED ORDER — ACETAMINOPHEN 500 MG PO TABS: 1000.0000 mg | ORAL_TABLET | Freq: Four times a day (QID) | ORAL | 2 refills | Status: AC | PRN

## 2023-07-26 MED ORDER — IBUPROFEN 800 MG PO TABS
800.0000 mg | ORAL_TABLET | Freq: Three times a day (TID) | ORAL | 0 refills | Status: AC | PRN
Start: 2023-07-26 — End: ?

## 2023-07-26 MED ORDER — AMOXICILLIN-POT CLAVULANATE 875-125 MG PO TABS: 1.0000 | ORAL_TABLET | Freq: Three times a day (TID) | ORAL | 0 refills | Status: AC

## 2023-07-26 NOTE — ED Triage Notes (Signed)
 Pt comes with belly pain. Pt states he has diverticulitis. Pt states this might be flare up. Pt states he might have eaten something to flare it up. Pt states left side. Pt denies any N/V.

## 2023-07-26 NOTE — ED Provider Notes (Signed)
 [facility] Provider Note    None    (approximate)   History   Abdominal Pain  Pt comes with belly pain. Pt states he has diverticulitis. Pt states this might be flare up. Pt states he might have eaten something to flare it up. Pt states left side. Pt denies any N/V.   HPI Steven Wall is a 28 y.o. male PMH diverticulitis presents for evaluation of abdominal pain - Left lower quadrant, moderate, present for the past 2-3 days -Received by some constipation, took a laxative, had some very soft stools but continues to have ongoing discomfort -No fever, vomiting - No dysuria, hematuria, urinary urgency or frequency  Per chart review, patient was admitted in 2019 for perforated diverticulitis.     Physical Exam   Triage Vital Signs: ED Triage Vitals  Encounter Vitals Group     BP 07/26/23 1443 (!) 161/94     Systolic BP Percentile --      Diastolic BP Percentile --      Pulse Rate 07/26/23 1443 87     Resp 07/26/23 1443 18     Temp 07/26/23 1443 98 F (36.7 C)     Temp src --      SpO2 07/26/23 1443 100 %     Weight 07/26/23 1441 (!) 340 lb (154.2 kg)     Height 07/26/23 1441 6' (1.829 m)     Head Circumference --      Peak Flow --      Pain Score 07/26/23 1441 6     Pain Loc --      Pain Education --      Exclude from Growth Chart --     Most recent vital signs: Vitals:   07/26/23 1443  BP: (!) 161/94  Pulse: 87  Resp: 18  Temp: 98 F (36.7 C)  SpO2: 100%     General: Awake, no distress.  CV:  Good peripheral perfusion. RRR, RP 2+ Resp:  Normal effort. CTAB Abd:  No distention.  Mild-moderate tenderness palpation left lower quadrant.  No left CVA tenderness.    ED Results / Procedures / Treatments   Labs (all labs ordered are listed, but only abnormal results are displayed) Labs Reviewed  COMPREHENSIVE METABOLIC PANEL WITH GFR - Abnormal; Notable for the following components:      Result Value   Calcium 8.8 (*)    All  other components within normal limits  LIPASE, BLOOD  CBC  URINALYSIS, ROUTINE W REFLEX MICROSCOPIC     EKG  N/a   RADIOLOGY N/a    PROCEDURES:  Critical Care performed: No  Procedures   MEDICATIONS ORDERED IN ED: Medications - No data to display   IMPRESSION / MDM / ASSESSMENT AND PLAN / ED COURSE  I reviewed the triage vital signs and the nursing notes.                              DDX/MDM/AP: Differential diagnosis includes, but is not limited to, suspect recurrent diverticulitis, do not suspect other acute intra-abdominal pathology at this time including appendicitis, pancreatitis, urolithiasis, biliary pathology.  Very benign exam here, very well-appearing patient, no systemic symptoms otherwise-highly doubt perforation or abscess.  Discussed with patient possibility of repeat CT imaging to ensure no complications or empiric treatment with antibiotics, and shared decision making he does prefer to proceed with empiric antibiotic treatment and plan for outpatient follow-up.  Plan: - labs in triage --no leukocytosis, no anemia, hepatobiliary labs normal - Augmentin - Tylenol , Motrin  as needed  Patient's presentation is most consistent with acute complicated illness / injury requiring diagnostic workup.   ED course below.   Clinical Course as of 07/26/23 1622  Mon Jul 26, 2023  1547 CBC unremarkable no leukocytosis, no anemia  CMP reviewed, unremarkable, lipase normal [MM]    Clinical Course User Index [MM] Collis Deaner, MD     FINAL CLINICAL IMPRESSION(S) / ED DIAGNOSES   Final diagnoses:  Left lower quadrant abdominal pain  Diverticulitis     Rx / DC Orders   ED Discharge Orders          Ordered    amoxicillin-clavulanate (AUGMENTIN) 875-125 MG tablet  3 times daily        07/26/23 1622    acetaminophen  (TYLENOL ) 500 MG tablet  Every 6 hours PRN        07/26/23 1622    ibuprofen  (ADVIL ) 800 MG tablet  Every 8 hours PRN        07/26/23  1622             Note:  This document was prepared using Dragon voice recognition software and may include unintentional dictation errors.   Collis Deaner, MD 07/26/23 1622

## 2023-07-26 NOTE — Discharge Instructions (Addendum)
 Your evaluation in the emergency department was overall reassuring, and I agree that you likely have recurrent diverticulitis.  Have started you on antibiotics to treat this.  I also prescribed Tylenol  and Motrin  to use as needed for any ongoing discomfort.  Please do follow-up closely with your primary care provider, and return to the emergency department with any new or worsening symptoms including worsened pain, fever, or any other symptoms concerning to you.

## 2023-08-02 ENCOUNTER — Other Ambulatory Visit (HOSPITAL_COMMUNITY): Payer: Self-pay

## 2023-08-02 ENCOUNTER — Other Ambulatory Visit: Payer: Self-pay

## 2023-08-02 NOTE — Progress Notes (Signed)
 Medication was misdelivered to patient's previous address. Will resend medication on 5.20.25 to [address] [place]. Patient verified updated address and agreed to new delivery date of 5.21.25. Advised patient to please contact pharmacy ASAP if medication is not received on expected date.

## 2023-08-02 NOTE — Progress Notes (Addendum)
 Next-Day courier (NO signature required) sending 08/03/23.  Tracking Number:  [id number]  [address] [place], [place] [address]

## 2023-08-23 ENCOUNTER — Other Ambulatory Visit: Payer: Self-pay

## 2023-08-25 ENCOUNTER — Other Ambulatory Visit: Payer: Self-pay

## 2023-08-25 ENCOUNTER — Encounter (INDEPENDENT_AMBULATORY_CARE_PROVIDER_SITE_OTHER): Payer: Self-pay

## 2023-08-25 NOTE — Progress Notes (Signed)
 Specialty Pharmacy Refill Coordination Note  Steven Wall is a 28 y.o. male contacted today regarding refills of specialty medication(s) Dupilumab  (Dupixent )   Patient requested Delivery   Delivery date: 08/27/23 Verified address: [address] [place]   Medication will be filled on 08/26/23.

## 2023-08-26 ENCOUNTER — Other Ambulatory Visit: Payer: Self-pay

## 2023-08-27 ENCOUNTER — Other Ambulatory Visit: Payer: Self-pay

## 2023-09-15 ENCOUNTER — Other Ambulatory Visit: Payer: Self-pay

## 2023-09-15 ENCOUNTER — Encounter (INDEPENDENT_AMBULATORY_CARE_PROVIDER_SITE_OTHER): Payer: Self-pay

## 2023-09-15 NOTE — Progress Notes (Signed)
 Specialty Pharmacy Refill Coordination Note  Steven Wall is a 28 y.o. male contacted today regarding refills of specialty medication(s) Dupilumab  (Dupixent )   Patient requested (Patient-Rptd) Delivery   Delivery date: 09/21/23   Verified address: (Patient-Rptd) [address] [place]   Medication will be filled on 09/20/23.

## 2023-10-06 ENCOUNTER — Other Ambulatory Visit: Payer: Self-pay

## 2023-10-06 ENCOUNTER — Encounter (INDEPENDENT_AMBULATORY_CARE_PROVIDER_SITE_OTHER): Payer: Self-pay

## 2023-10-08 ENCOUNTER — Other Ambulatory Visit: Payer: Self-pay

## 2023-10-14 DIAGNOSIS — Z79899 Other long term (current) drug therapy: Secondary | ICD-10-CM | POA: Diagnosis not present

## 2023-10-14 DIAGNOSIS — L2089 Other atopic dermatitis: Secondary | ICD-10-CM | POA: Diagnosis not present

## 2023-10-20 ENCOUNTER — Other Ambulatory Visit: Payer: Self-pay

## 2023-10-20 ENCOUNTER — Telehealth: Payer: Self-pay

## 2023-10-20 ENCOUNTER — Other Ambulatory Visit (HOSPITAL_COMMUNITY): Payer: Self-pay

## 2023-10-20 NOTE — Progress Notes (Signed)
 Specialty Pharmacy Refill Coordination Note  Spoke with Steven Wall is a 28 y.o. male contacted today regarding refills of specialty medication(s) Dupilumab  (Dupixent )  Doses on hand: 0  Injection date: 10/27/23   Patient requested: Delivery   Delivery date: 10/26/23   Verified address: [address] [place] [address]  Medication will be filled on 10/25/23.    This medication requires a new prior authorization, and is currently being processed by the PA team. Specialty Pharmacy team will contact patient if any delays arise.

## 2023-10-20 NOTE — Progress Notes (Signed)
 PA pending

## 2023-10-20 NOTE — Telephone Encounter (Signed)
 Pharmacy Patient Advocate Encounter   Received notification from Patient Pharmacy that prior authorization for Dupixent  is required/requested.   Insurance verification completed.   The patient is insured through [facility] .   Per test claim: PA required; PA submitted to above mentioned insurance via Latent Key/confirmation #/EOC [id number] Status is pending

## 2023-10-21 NOTE — Progress Notes (Signed)
 PA approved.

## 2023-10-21 NOTE — Telephone Encounter (Signed)
 Pharmacy Patient Advocate Encounter  Received notification from [facility] that Prior Authorization for Dupixent  has been APPROVED from 10/21/23 to 10/19/24   PA #/Case ID/Reference #: [id number]

## 2023-10-25 ENCOUNTER — Other Ambulatory Visit: Payer: Self-pay

## 2023-10-25 NOTE — Progress Notes (Signed)
 call cannot be completed - patient needs to call dupixent  and renew copay card

## 2023-10-27 ENCOUNTER — Other Ambulatory Visit: Payer: Self-pay

## 2023-10-28 ENCOUNTER — Other Ambulatory Visit: Payer: Self-pay

## 2023-11-17 ENCOUNTER — Other Ambulatory Visit: Payer: Self-pay

## 2023-11-18 ENCOUNTER — Other Ambulatory Visit: Payer: Self-pay | Admitting: Pharmacy Technician

## 2023-11-18 ENCOUNTER — Encounter (INDEPENDENT_AMBULATORY_CARE_PROVIDER_SITE_OTHER): Payer: Self-pay

## 2023-11-18 ENCOUNTER — Other Ambulatory Visit: Payer: Self-pay

## 2023-11-18 NOTE — Progress Notes (Signed)
 Specialty Pharmacy Refill Coordination Note  Steven Wall is a 28 y.o. male contacted today regarding refills of specialty medication(s) Dupilumab  (Dupixent )   Patient requested (Patient-Rptd) Delivery   Delivery date: 11/24/23 Verified address: (Patient-Rptd) [address] [place]   Medication will be filled on 11/23/23.   Called patient trying to confirm qty on hand but was unable no answer on phone or no Voicemail.

## 2023-11-23 ENCOUNTER — Other Ambulatory Visit: Payer: Self-pay

## 2023-12-14 ENCOUNTER — Encounter (INDEPENDENT_AMBULATORY_CARE_PROVIDER_SITE_OTHER): Payer: Self-pay

## 2023-12-14 ENCOUNTER — Other Ambulatory Visit: Payer: Self-pay

## 2023-12-15 ENCOUNTER — Other Ambulatory Visit: Payer: Self-pay

## 2023-12-15 ENCOUNTER — Other Ambulatory Visit: Payer: Self-pay | Admitting: Pharmacy Technician

## 2023-12-15 NOTE — Progress Notes (Signed)
 Specialty Pharmacy Refill Coordination Note  Steven Wall is a 28 y.o. male contacted today regarding refills of specialty medication(s) Dupilumab  (Dupixent )   Patient requested (Patient-Rptd) Delivery   Delivery date: 12/22/23 Verified address: (Patient-Rptd) [address] [place]   Medication will be filled on 12/21/23.   Try to call patient to verify remaining qty. Called said could not be completed.

## 2023-12-21 ENCOUNTER — Other Ambulatory Visit: Payer: Self-pay

## 2024-01-11 ENCOUNTER — Other Ambulatory Visit: Payer: Self-pay

## 2024-01-13 ENCOUNTER — Other Ambulatory Visit: Payer: Self-pay

## 2024-01-13 ENCOUNTER — Other Ambulatory Visit (HOSPITAL_COMMUNITY): Payer: Self-pay

## 2024-01-17 ENCOUNTER — Other Ambulatory Visit: Payer: Self-pay

## 2024-01-17 NOTE — Progress Notes (Signed)
 Specialty Pharmacy Refill Coordination Note  Steven Wall is a 29 y.o. male contacted today regarding refills of specialty medication(s) Dupilumab  (Dupixent )   Patient requested Delivery   Delivery date: 01/27/24   Verified address: [address] [place] [address]   Medication will be filled on: 01/26/24

## 2024-01-17 NOTE — Progress Notes (Signed)
 Specialty Pharmacy Ongoing Clinical Assessment Note  Steven Wall is a 28 y.o. male who is being followed by the specialty pharmacy service for RxSp Atopic Dermatitis   Patient's specialty medication(s) reviewed today: Dupilumab  (Dupixent )   Missed doses in the last 4 weeks: 0   Patient/Caregiver did not have any additional questions or concerns.   Therapeutic benefit summary: Patient is achieving benefit   Adverse events/side effects summary: No adverse events/side effects   Patient's therapy is appropriate to: Continue    Goals Addressed             This Visit's Progress    Maintain optimal adherence to therapy   On track    Patient is on track. Patient will maintain adherence         Follow up: 12 months  Silvano LOISE Dolly Specialty Pharmacist

## 2024-01-26 ENCOUNTER — Other Ambulatory Visit: Payer: Self-pay

## 2024-02-16 ENCOUNTER — Other Ambulatory Visit: Payer: Self-pay

## 2024-02-18 ENCOUNTER — Other Ambulatory Visit (HOSPITAL_COMMUNITY): Payer: Self-pay

## 2024-02-18 ENCOUNTER — Other Ambulatory Visit: Payer: Self-pay

## 2024-02-21 ENCOUNTER — Other Ambulatory Visit: Payer: Self-pay

## 2024-02-21 ENCOUNTER — Other Ambulatory Visit (HOSPITAL_COMMUNITY): Payer: Self-pay

## 2024-02-22 ENCOUNTER — Other Ambulatory Visit: Payer: Self-pay

## 2024-02-22 ENCOUNTER — Other Ambulatory Visit (HOSPITAL_COMMUNITY): Payer: Self-pay

## 2024-02-22 NOTE — Progress Notes (Signed)
 Specialty Pharmacy Refill Coordination Note  MyChart Questionnaire Submission  Steven Wall is a 28 y.o. male contacted today regarding refills of specialty medication(s) Dupixent .  Doses on hand: 1 for 12/9   Injection date: 03/07/24  Patient requested: (Patient-Rptd) Delivery   Delivery date: 12/18//25  Verified address: [address] [place] [address]  Medication will be filled on 03/01/24

## 2024-03-01 ENCOUNTER — Other Ambulatory Visit: Payer: Self-pay

## 2024-03-01 ENCOUNTER — Other Ambulatory Visit (HOSPITAL_COMMUNITY): Payer: Self-pay

## 2024-03-01 NOTE — Progress Notes (Signed)
 Per Luke Blowers we can use [facility] $0 to offset $250 copay for patient.

## 2024-03-22 ENCOUNTER — Other Ambulatory Visit (HOSPITAL_BASED_OUTPATIENT_CLINIC_OR_DEPARTMENT_OTHER): Payer: Self-pay

## 2024-03-22 ENCOUNTER — Other Ambulatory Visit: Payer: Self-pay

## 2024-03-22 NOTE — Progress Notes (Signed)
 Manufacturer copay card is applying, but limited due to an incorrect copay maximizer flag on the manufacturer side.  Steven Wall is actively working with [employer] to review plan configuration and determine if any corrections can be made on insurance side. At this time, the patient will need to contact the manufacturer copay card program and tell them to remove flag so they will have access to full funding.   Patient advised via MyChart to contact the manufacturer copay card progam directly, provide insurance details and request flag be removed so full benefits can be applied. Instructed patient to call pharmacy once issue resolved.

## 2024-03-24 ENCOUNTER — Other Ambulatory Visit: Payer: Self-pay

## 2024-03-28 ENCOUNTER — Other Ambulatory Visit: Payer: Self-pay

## 2024-03-29 ENCOUNTER — Other Ambulatory Visit: Payer: Self-pay

## 2024-04-10 ENCOUNTER — Other Ambulatory Visit: Payer: Self-pay
